# Patient Record
Sex: Male | Born: 1950 | Race: White | Hispanic: No | Marital: Married | Smoking: Never smoker
Health system: Southern US, Community
[De-identification: ages and names within clinical notes are randomized; demographics above are authoritative.]

## PROBLEM LIST (undated history)

## (undated) DIAGNOSIS — Z87442 Personal history of urinary calculi: Secondary | ICD-10-CM

## (undated) DIAGNOSIS — E119 Type 2 diabetes mellitus without complications: Secondary | ICD-10-CM

## (undated) DIAGNOSIS — I10 Essential (primary) hypertension: Secondary | ICD-10-CM

## (undated) DIAGNOSIS — Z9889 Other specified postprocedural states: Secondary | ICD-10-CM

## (undated) DIAGNOSIS — C801 Malignant (primary) neoplasm, unspecified: Secondary | ICD-10-CM

## (undated) DIAGNOSIS — G4730 Sleep apnea, unspecified: Secondary | ICD-10-CM

## (undated) DIAGNOSIS — D649 Anemia, unspecified: Secondary | ICD-10-CM

---

## 1999-07-09 ENCOUNTER — Emergency Department (HOSPITAL_COMMUNITY): Admission: EM | Admit: 1999-07-09 | Discharge: 1999-07-09 | Payer: Self-pay

## 2009-12-27 HISTORY — PX: CYSTOSCOPY W/ STONE MANIPULATION: SHX1427

## 2010-11-28 ENCOUNTER — Emergency Department (HOSPITAL_COMMUNITY)
Admission: EM | Admit: 2010-11-28 | Discharge: 2010-11-28 | Payer: Self-pay | Source: Home / Self Care | Admitting: Emergency Medicine

## 2012-01-26 IMAGING — CR DG CERVICAL SPINE COMPLETE 4+V
6 series · 6 of 6 positions shown · non-contrast
Comparison: None.

CLINICAL DATA: Fall, trauma, neck pain

CERVICAL SPINE - COMPLETE 4+ VIEW

[w c-spine lat * (1 of 2)]
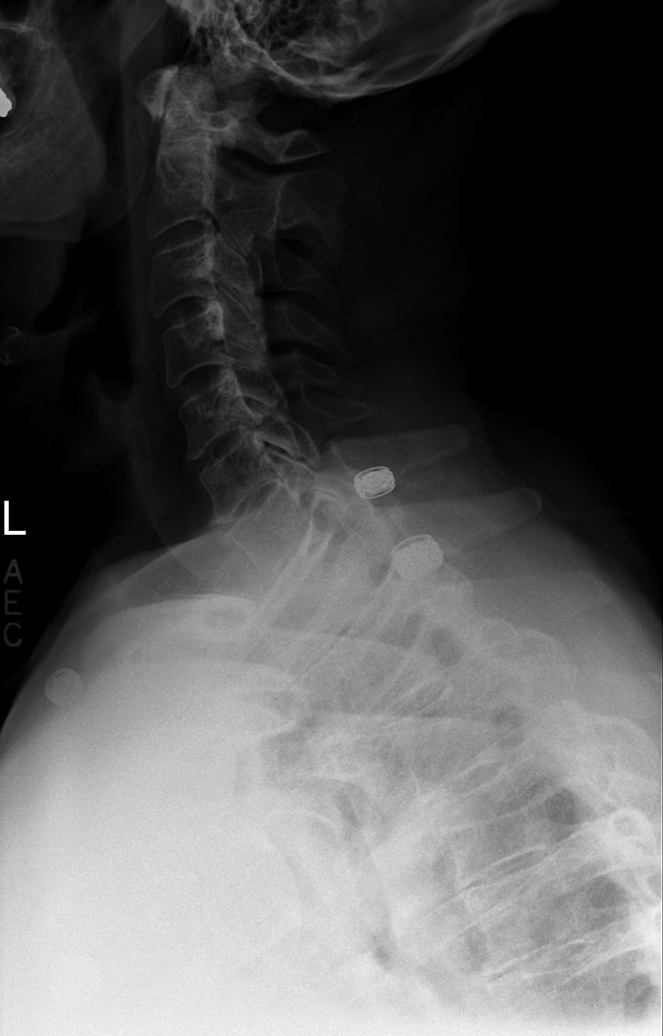

[w c-spine lat * (2 of 2)]
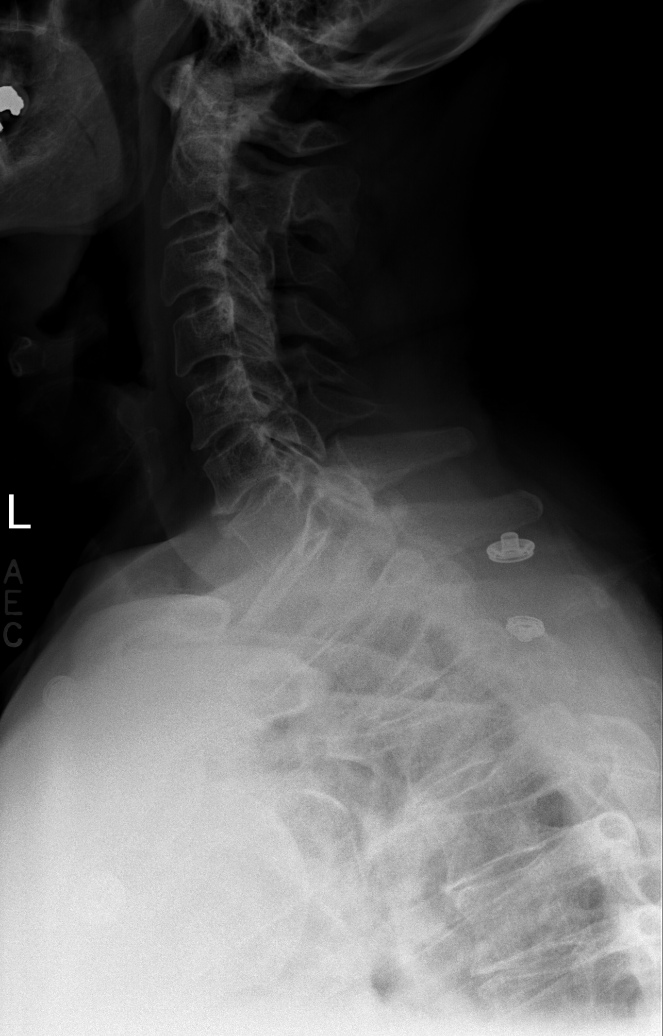

[w c-spine oblique (1 of 2)]
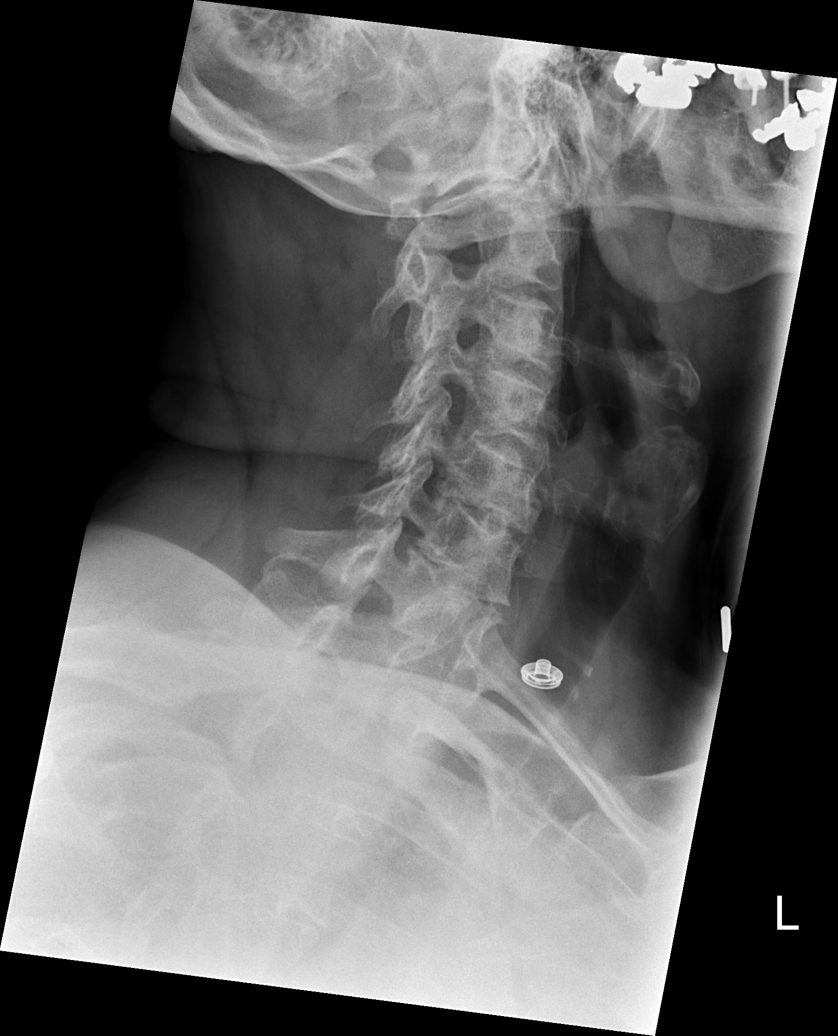

[w c-spine oblique (2 of 2)]
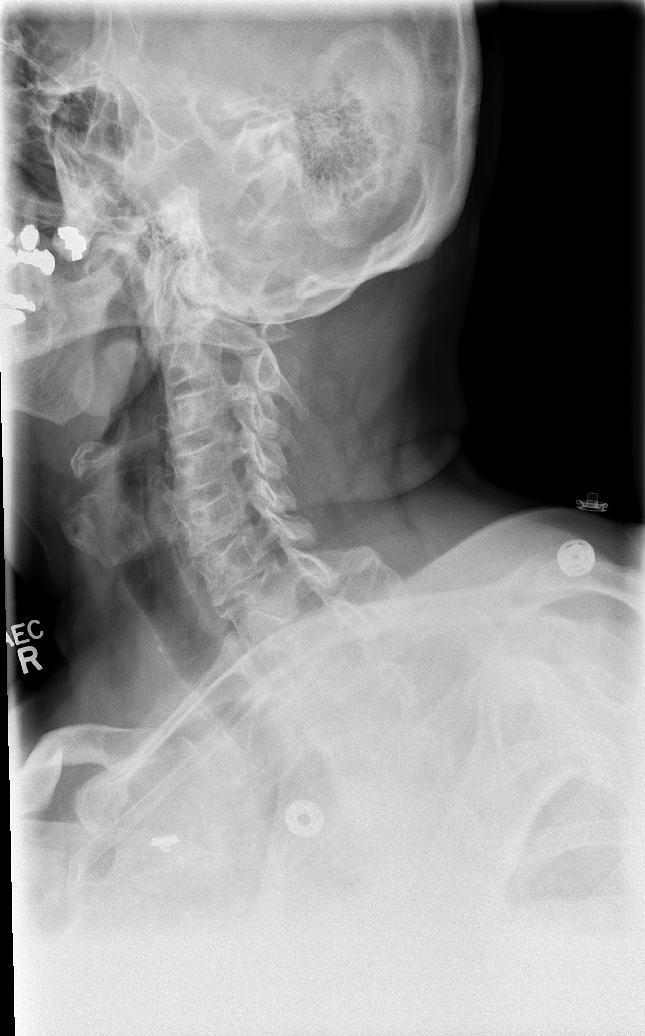

[w c-spine a.p. *]
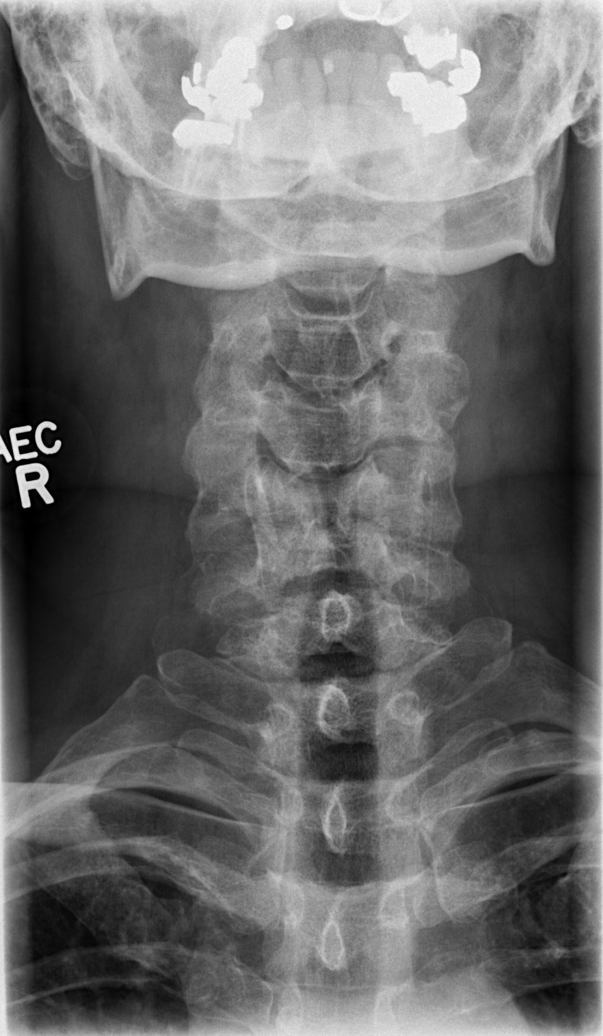

[w c-spine odontoid *]
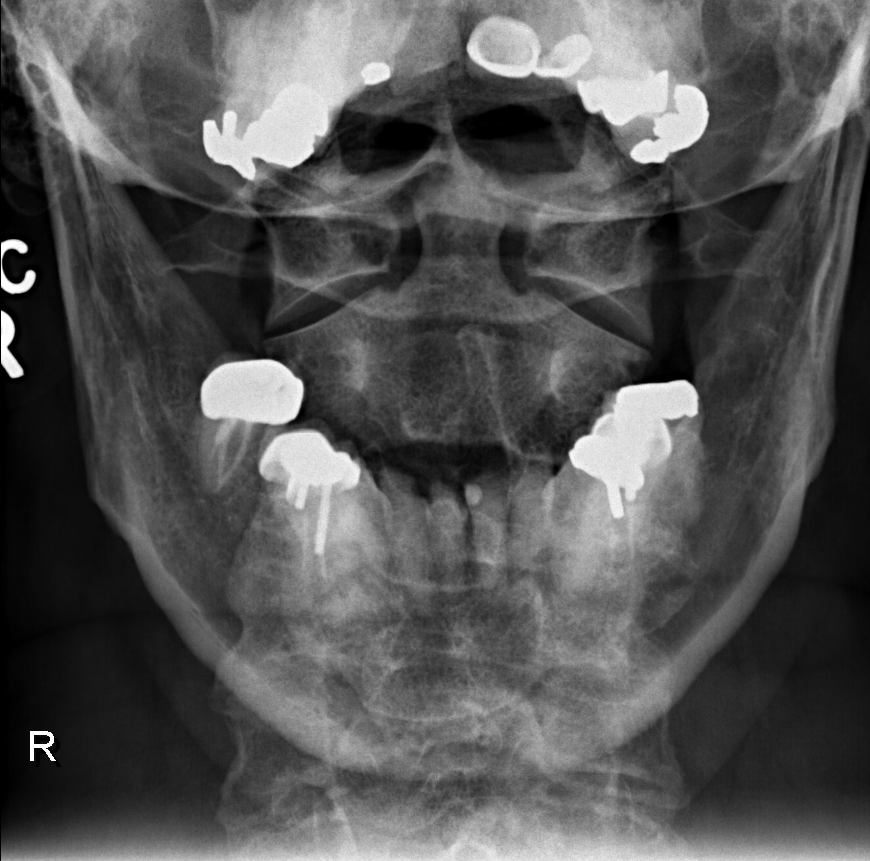

[6 of 6 positions shown; findings below may reference images not displayed]

FINDINGS: Normal cervical spine alignment.  Mild spondylosis and
degenerative changes at C5-6 and C6-7.  No fracture demonstrated,
compression deformity, wedge shaped abnormality or focal kyphosis.
Facets aligned.  Intact odontoid.
IMPRESSION: Lower cervical degenerative changes.

## 2013-12-27 HISTORY — PX: LITHOTRIPSY: SUR834

## 2023-05-18 ENCOUNTER — Inpatient Hospital Stay (HOSPITAL_COMMUNITY)
Admission: EM | Admit: 2023-05-18 | Discharge: 2023-05-20 | DRG: 660 | Disposition: A | Discharge status: Home / Self Care | Payer: Medicare Other | Attending: Internal Medicine | Admitting: Internal Medicine

## 2023-05-18 ENCOUNTER — Other Ambulatory Visit: Payer: Self-pay

## 2023-05-18 ENCOUNTER — Observation Stay (HOSPITAL_COMMUNITY): Payer: Medicare Other

## 2023-05-18 ENCOUNTER — Encounter (HOSPITAL_COMMUNITY): Payer: Self-pay

## 2023-05-18 ENCOUNTER — Encounter (HOSPITAL_COMMUNITY)
Admission: EM | Disposition: A | Discharge status: Home / Self Care | Payer: Self-pay | Source: Home / Self Care | Attending: Internal Medicine

## 2023-05-18 ENCOUNTER — Emergency Department (HOSPITAL_COMMUNITY): Payer: Medicare Other

## 2023-05-18 ENCOUNTER — Observation Stay (HOSPITAL_BASED_OUTPATIENT_CLINIC_OR_DEPARTMENT_OTHER): Payer: Medicare Other | Admitting: Anesthesiology

## 2023-05-18 ENCOUNTER — Observation Stay (HOSPITAL_COMMUNITY): Payer: Medicare Other | Admitting: Anesthesiology

## 2023-05-18 DIAGNOSIS — Z9989 Dependence on other enabling machines and devices: Secondary | ICD-10-CM

## 2023-05-18 DIAGNOSIS — E119 Type 2 diabetes mellitus without complications: Secondary | ICD-10-CM

## 2023-05-18 DIAGNOSIS — N201 Calculus of ureter: Secondary | ICD-10-CM

## 2023-05-18 DIAGNOSIS — G4733 Obstructive sleep apnea (adult) (pediatric): Secondary | ICD-10-CM | POA: Diagnosis not present

## 2023-05-18 DIAGNOSIS — Z794 Long term (current) use of insulin: Secondary | ICD-10-CM

## 2023-05-18 DIAGNOSIS — N179 Acute kidney failure, unspecified: Principal | ICD-10-CM | POA: Diagnosis present

## 2023-05-18 DIAGNOSIS — N200 Calculus of kidney: Secondary | ICD-10-CM | POA: Diagnosis not present

## 2023-05-18 DIAGNOSIS — E785 Hyperlipidemia, unspecified: Secondary | ICD-10-CM | POA: Diagnosis present

## 2023-05-18 DIAGNOSIS — N202 Calculus of kidney with calculus of ureter: Secondary | ICD-10-CM | POA: Diagnosis not present

## 2023-05-18 DIAGNOSIS — Z9641 Presence of insulin pump (external) (internal): Secondary | ICD-10-CM | POA: Diagnosis present

## 2023-05-18 DIAGNOSIS — D631 Anemia in chronic kidney disease: Secondary | ICD-10-CM | POA: Diagnosis present

## 2023-05-18 DIAGNOSIS — Z888 Allergy status to other drugs, medicaments and biological substances status: Secondary | ICD-10-CM

## 2023-05-18 DIAGNOSIS — I10 Essential (primary) hypertension: Secondary | ICD-10-CM

## 2023-05-18 DIAGNOSIS — N289 Disorder of kidney and ureter, unspecified: Secondary | ICD-10-CM

## 2023-05-18 DIAGNOSIS — G4730 Sleep apnea, unspecified: Secondary | ICD-10-CM | POA: Insufficient documentation

## 2023-05-18 DIAGNOSIS — N1832 Chronic kidney disease, stage 3b: Secondary | ICD-10-CM | POA: Insufficient documentation

## 2023-05-18 DIAGNOSIS — E1122 Type 2 diabetes mellitus with diabetic chronic kidney disease: Secondary | ICD-10-CM | POA: Diagnosis present

## 2023-05-18 DIAGNOSIS — E875 Hyperkalemia: Secondary | ICD-10-CM | POA: Diagnosis not present

## 2023-05-18 DIAGNOSIS — I129 Hypertensive chronic kidney disease with stage 1 through stage 4 chronic kidney disease, or unspecified chronic kidney disease: Secondary | ICD-10-CM | POA: Diagnosis present

## 2023-05-18 DIAGNOSIS — D649 Anemia, unspecified: Secondary | ICD-10-CM | POA: Insufficient documentation

## 2023-05-18 HISTORY — DX: Type 2 diabetes mellitus without complications: E11.9

## 2023-05-18 HISTORY — DX: Sleep apnea, unspecified: G47.30

## 2023-05-18 HISTORY — DX: Essential (primary) hypertension: I10

## 2023-05-18 HISTORY — PX: CYSTOSCOPY WITH RETROGRADE PYELOGRAM, URETEROSCOPY AND STENT PLACEMENT: SHX5789

## 2023-05-18 LAB — URINALYSIS, ROUTINE W REFLEX MICROSCOPIC
Bilirubin Urine: NEGATIVE
Glucose, UA: NEGATIVE mg/dL
Ketones, ur: NEGATIVE mg/dL
Leukocytes,Ua: NEGATIVE
Nitrite: NEGATIVE
Protein, ur: NEGATIVE mg/dL
Specific Gravity, Urine: 1.015 (ref 1.005–1.030)
pH: 5 (ref 5.0–8.0)

## 2023-05-18 LAB — COMPREHENSIVE METABOLIC PANEL
ALT: 17 U/L (ref 0–44)
AST: 15 U/L (ref 15–41)
Albumin: 3.8 g/dL (ref 3.5–5.0)
Alkaline Phosphatase: 84 U/L (ref 38–126)
Anion gap: 10 (ref 5–15)
BUN: 48 mg/dL — ABNORMAL HIGH (ref 8–23)
CO2: 22 mmol/L (ref 22–32)
Calcium: 8.9 mg/dL (ref 8.9–10.3)
Chloride: 104 mmol/L (ref 98–111)
Creatinine, Ser: 3.06 mg/dL — ABNORMAL HIGH (ref 0.61–1.24)
GFR, Estimated: 21 mL/min — ABNORMAL LOW (ref >60)
Glucose, Bld: 219 mg/dL — ABNORMAL HIGH (ref 70–99)
Potassium: 4.9 mmol/L (ref 3.5–5.1)
Sodium: 136 mmol/L (ref 135–145)
Total Bilirubin: 0.9 mg/dL (ref 0.3–1.2)
Total Protein: 7.7 g/dL (ref 6.5–8.1)

## 2023-05-18 LAB — CBC WITH DIFFERENTIAL/PLATELET
Abs Immature Granulocytes: 0.05 10*3/uL (ref 0.00–0.07)
Basophils Absolute: 0.1 10*3/uL (ref 0.0–0.1)
Basophils Relative: 0 %
Eosinophils Absolute: 0 10*3/uL (ref 0.0–0.5)
Eosinophils Relative: 0 %
HCT: 36.1 % — ABNORMAL LOW (ref 39.0–52.0)
Hemoglobin: 11.8 g/dL — ABNORMAL LOW (ref 13.0–17.0)
Immature Granulocytes: 0 %
Lymphocytes Relative: 5 %
Lymphs Abs: 0.6 10*3/uL — ABNORMAL LOW (ref 0.7–4.0)
MCH: 28.1 pg (ref 26.0–34.0)
MCHC: 32.7 g/dL (ref 30.0–36.0)
MCV: 86 fL (ref 80.0–100.0)
Monocytes Absolute: 0.6 10*3/uL (ref 0.1–1.0)
Monocytes Relative: 5 %
Neutro Abs: 10.8 10*3/uL — ABNORMAL HIGH (ref 1.7–7.7)
Neutrophils Relative %: 90 %
Platelets: 232 10*3/uL (ref 150–400)
RBC: 4.2 MIL/uL — ABNORMAL LOW (ref 4.22–5.81)
RDW: 13.7 % (ref 11.5–15.5)
WBC: 12.2 10*3/uL — ABNORMAL HIGH (ref 4.0–10.5)
nRBC: 0 % (ref 0.0–0.2)

## 2023-05-18 LAB — GLUCOSE, CAPILLARY
Glucose-Capillary: 170 mg/dL — ABNORMAL HIGH (ref 70–99)
Glucose-Capillary: 167 mg/dL — ABNORMAL HIGH (ref 70–99)
Glucose-Capillary: 194 mg/dL — ABNORMAL HIGH (ref 70–99)
Glucose-Capillary: 261 mg/dL — ABNORMAL HIGH (ref 70–99)

## 2023-05-18 SURGERY — CYSTOURETEROSCOPY, WITH RETROGRADE PYELOGRAM AND STENT INSERTION
Anesthesia: General | Laterality: Left

## 2023-05-18 MED ORDER — ORAL CARE MOUTH RINSE: 15.0000 mL | Freq: Once | OROMUCOSAL | Status: DC

## 2023-05-18 MED ORDER — PROPOFOL 10 MG/ML IV BOLUS
INTRAVENOUS | Status: AC
  Filled 2023-05-18: qty 20

## 2023-05-18 MED ORDER — SUCCINYLCHOLINE CHLORIDE 200 MG/10ML IV SOSY
PREFILLED_SYRINGE | INTRAVENOUS | Status: AC
  Filled 2023-05-18: qty 20

## 2023-05-18 MED ORDER — LACTATED RINGERS IV SOLN: INTRAVENOUS | Status: DC

## 2023-05-18 MED ORDER — PROMETHAZINE HCL 25 MG/ML IJ SOLN: 6.2500 mg | INTRAMUSCULAR | Status: DC | PRN

## 2023-05-18 MED ORDER — PHENYLEPHRINE HCL (PRESSORS) 10 MG/ML IV SOLN
INTRAVENOUS | Status: DC | PRN
  Administered 2023-05-18: 160 ug via INTRAVENOUS

## 2023-05-18 MED ORDER — FENTANYL CITRATE (PF) 100 MCG/2ML IJ SOLN
INTRAMUSCULAR | Status: AC
  Filled 2023-05-18: qty 2

## 2023-05-18 MED ORDER — ROCURONIUM BROMIDE 10 MG/ML (PF) SYRINGE
PREFILLED_SYRINGE | INTRAVENOUS | Status: AC
  Filled 2023-05-18: qty 20

## 2023-05-18 MED ORDER — ORAL CARE MOUTH RINSE: 15.0000 mL | OROMUCOSAL | Status: DC | PRN

## 2023-05-18 MED ORDER — SUGAMMADEX SODIUM 200 MG/2ML IV SOLN
INTRAVENOUS | Status: DC | PRN
  Administered 2023-05-18: 200 mg via INTRAVENOUS
  Administered 2023-05-18: 600 mg via INTRAVENOUS

## 2023-05-18 MED ORDER — AMISULPRIDE (ANTIEMETIC) 5 MG/2ML IV SOLN
INTRAVENOUS | Status: AC
  Filled 2023-05-18: qty 4

## 2023-05-18 MED ORDER — MORPHINE SULFATE (PF) 4 MG/ML IV SOLN
4.0000 mg | Freq: Once | INTRAVENOUS | Status: AC
  Administered 2023-05-18: 4 mg via INTRAVENOUS
  Filled 2023-05-18: qty 1

## 2023-05-18 MED ORDER — ONDANSETRON HCL 4 MG/2ML IJ SOLN
4.0000 mg | Freq: Once | INTRAMUSCULAR | Status: AC
  Administered 2023-05-18: 4 mg via INTRAVENOUS
  Filled 2023-05-18: qty 2

## 2023-05-18 MED ORDER — FENTANYL CITRATE PF 50 MCG/ML IJ SOSY: 25.0000 ug | PREFILLED_SYRINGE | INTRAMUSCULAR | Status: DC | PRN

## 2023-05-18 MED ORDER — PHENYLEPHRINE 80 MCG/ML (10ML) SYRINGE FOR IV PUSH (FOR BLOOD PRESSURE SUPPORT)
PREFILLED_SYRINGE | INTRAVENOUS | Status: AC
  Filled 2023-05-18: qty 10

## 2023-05-18 MED ORDER — AMLODIPINE BESYLATE 10 MG PO TABS
10.0000 mg | ORAL_TABLET | Freq: Every day | ORAL | Status: DC
  Administered 2023-05-19 – 2023-05-20 (×2): 10 mg via ORAL
  Filled 2023-05-18 (×2): qty 1

## 2023-05-18 MED ORDER — POTASSIUM CHLORIDE IN NACL 20-0.9 MEQ/L-% IV SOLN
INTRAVENOUS | Status: DC
  Filled 2023-05-18 (×2): qty 1000

## 2023-05-18 MED ORDER — MIDAZOLAM HCL 5 MG/5ML IJ SOLN
INTRAMUSCULAR | Status: DC | PRN
  Administered 2023-05-18 (×2): 1 mg via INTRAVENOUS

## 2023-05-18 MED ORDER — DEXAMETHASONE SODIUM PHOSPHATE 10 MG/ML IJ SOLN
INTRAMUSCULAR | Status: DC | PRN
  Administered 2023-05-18: 10 mg via INTRAVENOUS

## 2023-05-18 MED ORDER — ACETAMINOPHEN 650 MG RE SUPP: 650.0000 mg | Freq: Four times a day (QID) | RECTAL | Status: DC | PRN

## 2023-05-18 MED ORDER — LIDOCAINE HCL (CARDIAC) PF 100 MG/5ML IV SOSY
PREFILLED_SYRINGE | INTRAVENOUS | Status: DC | PRN
  Administered 2023-05-18: 20 mg via INTRAVENOUS

## 2023-05-18 MED ORDER — OXYCODONE HCL 5 MG PO TABS: 5.0000 mg | ORAL_TABLET | Freq: Once | ORAL | Status: DC | PRN

## 2023-05-18 MED ORDER — INSULIN ASPART 100 UNIT/ML IJ SOLN
0.0000 [IU] | Freq: Three times a day (TID) | INTRAMUSCULAR | Status: DC
  Administered 2023-05-18: 3 [IU] via SUBCUTANEOUS
  Administered 2023-05-19: 11 [IU] via SUBCUTANEOUS
  Administered 2023-05-19: 8 [IU] via SUBCUTANEOUS
  Administered 2023-05-19: 5 [IU] via SUBCUTANEOUS
  Administered 2023-05-20: 3 [IU] via SUBCUTANEOUS

## 2023-05-18 MED ORDER — EPHEDRINE SULFATE (PRESSORS) 50 MG/ML IJ SOLN
INTRAMUSCULAR | Status: DC | PRN
  Administered 2023-05-18: 5 mg via INTRAVENOUS

## 2023-05-18 MED ORDER — OXYCODONE HCL 5 MG PO TABS: 5.0000 mg | ORAL_TABLET | ORAL | Status: DC | PRN

## 2023-05-18 MED ORDER — PROPOFOL 10 MG/ML IV BOLUS
INTRAVENOUS | Status: DC | PRN
  Administered 2023-05-18: 150 mg via INTRAVENOUS
  Administered 2023-05-18: 50 mg via INTRAVENOUS

## 2023-05-18 MED ORDER — MIDAZOLAM HCL 2 MG/2ML IJ SOLN: 0.5000 mg | Freq: Once | INTRAMUSCULAR | Status: DC | PRN

## 2023-05-18 MED ORDER — ALBUTEROL SULFATE (2.5 MG/3ML) 0.083% IN NEBU: 2.5000 mg | INHALATION_SOLUTION | RESPIRATORY_TRACT | Status: DC | PRN

## 2023-05-18 MED ORDER — ROCURONIUM BROMIDE 100 MG/10ML IV SOLN
INTRAVENOUS | Status: DC | PRN
  Administered 2023-05-18: 50 mg via INTRAVENOUS

## 2023-05-18 MED ORDER — DEXAMETHASONE SODIUM PHOSPHATE 10 MG/ML IJ SOLN
INTRAMUSCULAR | Status: AC
  Filled 2023-05-18: qty 1

## 2023-05-18 MED ORDER — ATORVASTATIN CALCIUM 40 MG PO TABS
40.0000 mg | ORAL_TABLET | Freq: Every day | ORAL | Status: DC
  Administered 2023-05-18 – 2023-05-19 (×2): 40 mg via ORAL
  Filled 2023-05-18 (×2): qty 1

## 2023-05-18 MED ORDER — LIDOCAINE HCL (PF) 2 % IJ SOLN
INTRAMUSCULAR | Status: AC
  Filled 2023-05-18: qty 5

## 2023-05-18 MED ORDER — SODIUM CHLORIDE 0.9 % IR SOLN
Status: DC | PRN
  Administered 2023-05-18: 3000 mL via INTRAVESICAL

## 2023-05-18 MED ORDER — INSULIN ASPART 100 UNIT/ML IJ SOLN
0.0000 [IU] | Freq: Every day | INTRAMUSCULAR | Status: DC
  Administered 2023-05-18: 3 [IU] via SUBCUTANEOUS
  Administered 2023-05-20: 4 [IU] via SUBCUTANEOUS

## 2023-05-18 MED ORDER — SODIUM CHLORIDE 0.9 % IV BOLUS
1000.0000 mL | Freq: Once | INTRAVENOUS | Status: AC
  Administered 2023-05-18: 1000 mL via INTRAVENOUS

## 2023-05-18 MED ORDER — CHLORHEXIDINE GLUCONATE 0.12 % MT SOLN: 15.0000 mL | Freq: Once | OROMUCOSAL | Status: DC

## 2023-05-18 MED ORDER — CIPROFLOXACIN IN D5W 400 MG/200ML IV SOLN
400.0000 mg | Freq: Once | INTRAVENOUS | Status: AC
  Administered 2023-05-18: 400 mg via INTRAVENOUS
  Filled 2023-05-18: qty 200

## 2023-05-18 MED ORDER — OXYCODONE HCL 5 MG/5ML PO SOLN: 5.0000 mg | Freq: Once | ORAL | Status: DC | PRN

## 2023-05-18 MED ORDER — HYDROMORPHONE HCL 1 MG/ML IJ SOLN
0.5000 mg | Freq: Once | INTRAMUSCULAR | Status: AC
  Administered 2023-05-18: 0.5 mg via INTRAVENOUS
  Filled 2023-05-18: qty 1

## 2023-05-18 MED ORDER — TRAZODONE HCL 50 MG PO TABS: 25.0000 mg | ORAL_TABLET | Freq: Every evening | ORAL | Status: DC | PRN

## 2023-05-18 MED ORDER — MEPERIDINE HCL 50 MG/ML IJ SOLN: 6.2500 mg | INTRAMUSCULAR | Status: DC | PRN

## 2023-05-18 MED ORDER — EPHEDRINE 5 MG/ML INJ
INTRAVENOUS | Status: AC
  Filled 2023-05-18: qty 5

## 2023-05-18 MED ORDER — ENOXAPARIN SODIUM 30 MG/0.3ML IJ SOSY: 30.0000 mg | PREFILLED_SYRINGE | INTRAMUSCULAR | Status: DC

## 2023-05-18 MED ORDER — FENTANYL CITRATE (PF) 100 MCG/2ML IJ SOLN
INTRAMUSCULAR | Status: DC | PRN
  Administered 2023-05-18 (×2): 50 ug via INTRAVENOUS

## 2023-05-18 MED ORDER — MIDAZOLAM HCL 2 MG/2ML IJ SOLN
INTRAMUSCULAR | Status: AC
  Filled 2023-05-18: qty 2

## 2023-05-18 MED ORDER — KETOROLAC TROMETHAMINE 30 MG/ML IJ SOLN: 30.0000 mg | Freq: Once | INTRAMUSCULAR | Status: DC

## 2023-05-18 MED ORDER — METOPROLOL TARTRATE 5 MG/5ML IV SOLN: 5.0000 mg | Freq: Four times a day (QID) | INTRAVENOUS | Status: DC | PRN

## 2023-05-18 MED ORDER — IOHEXOL 300 MG/ML  SOLN
INTRAMUSCULAR | Status: DC | PRN
  Administered 2023-05-18: 7 mL via URETHRAL

## 2023-05-18 MED ORDER — ACETAMINOPHEN 325 MG PO TABS: 650.0000 mg | ORAL_TABLET | Freq: Four times a day (QID) | ORAL | Status: DC | PRN

## 2023-05-18 MED ORDER — AMISULPRIDE (ANTIEMETIC) 5 MG/2ML IV SOLN
10.0000 mg | Freq: Once | INTRAVENOUS | Status: AC
  Administered 2023-05-18: 10 mg via INTRAVENOUS

## 2023-05-18 MED ORDER — ONDANSETRON HCL 4 MG/2ML IJ SOLN: 4.0000 mg | Freq: Four times a day (QID) | INTRAMUSCULAR | Status: DC | PRN

## 2023-05-18 MED ORDER — ONDANSETRON HCL 4 MG PO TABS: 4.0000 mg | ORAL_TABLET | Freq: Four times a day (QID) | ORAL | Status: DC | PRN

## 2023-05-18 SURGICAL SUPPLY — 11 items
BAG URO CATCHER STRL LF (MISCELLANEOUS) ×1 IMPLANT
CATH URETL OPEN END 6FR 70 (CATHETERS) ×1 IMPLANT
CLOTH BEACON ORANGE TIMEOUT ST (SAFETY) ×1 IMPLANT
GLOVE SURG LX STRL 7.5 STRW (GLOVE) ×1 IMPLANT
GOWN STRL REUS W/ TWL XL LVL3 (GOWN DISPOSABLE) ×2 IMPLANT
GOWN STRL REUS W/TWL XL LVL3 (GOWN DISPOSABLE) ×2
GUIDEWIRE STR DUAL SENSOR (WIRE) ×1 IMPLANT
MANIFOLD NEPTUNE II (INSTRUMENTS) ×1 IMPLANT
PACK CYSTO (CUSTOM PROCEDURE TRAY) ×1 IMPLANT
STENT URET 6FRX26 CONTOUR (STENTS) IMPLANT
TUBING CONNECTING 10 (TUBING) IMPLANT

## 2023-05-18 NOTE — Anesthesia Procedure Notes (Signed)
Procedure Name: Intubation Date/Time: 05/18/2023 4:22 PM  Performed by: Shanon Payor, CRNAPre-anesthesia Checklist: Patient identified, Emergency Drugs available, Suction available, Patient being monitored and Timeout performed Patient Re-evaluated:Patient Re-evaluated prior to induction Oxygen Delivery Method: Circle system utilized Preoxygenation: Pre-oxygenation with 100% oxygen Induction Type: IV induction, Rapid sequence and Cricoid Pressure applied Ventilation: Mask ventilation without difficulty Laryngoscope Size: Mac and 3 Grade View: Grade II Tube type: Oral Tube size: 7.5 mm Number of attempts: 1 Airway Equipment and Method: Stylet Placement Confirmation: ETT inserted through vocal cords under direct vision, positive ETCO2, CO2 detector and breath sounds checked- equal and bilateral Secured at: 23 cm Tube secured with: Tape Dental Injury: Teeth and Oropharynx as per pre-operative assessment

## 2023-05-18 NOTE — H&P (Signed)
History and Physical  Jorge Alexander:[id number] DOB: [birth year] DOA: 05/18/2023  PCP: Everlean Cherry, MD   Chief Complaint: left flank pain, vomiting   HPI: Jorge Alexander is a 72 y.o. male with medical history significant for diabetes and diagnosed with 2 obstructing left-sided kidney stones yesterday being admitted to the hospital with acute renal failure and persistent pain and nausea.  Pain began 2 days ago, seen at [facility] ER yesterday diagnosed with stone.  He was feeling better and sent home with pain and nausea medication.  Continues to have pain and significant vomiting so presented here today.  Lab work reveals creatinine up to 3.0, from baseline 1.6.  ER provider discussed with urology Dr. Sherron Monday who has taken the patient to the operating suite for cystoscopy and stent placement. Patient seen after procedure and feels much better, no pain or nausea.  Review of Systems: Please see HPI for pertinent positives and negatives. A complete 10 system review of systems are otherwise negative.  Past Medical History:  Diagnosis Date   Diabetes mellitus without complication (HCC)    History reviewed. No pertinent surgical history.  Social History:  reports that he has never smoked. He has never used smokeless tobacco. He reports that he does not currently use alcohol. He reports that he does not currently use drugs.   No Known Allergies  History reviewed. No pertinent family history.   Prior to Admission medications   Not on File    Physical Exam: BP 135/67   Pulse (!) 59   Temp 97.6 F (36.4 C)   Resp 19   Ht 5\' 7"  (1.702 m)   Wt 113.4 kg   SpO2 94%   BMI 39.16 kg/m   General:  Alert, oriented, calm, in no acute distress  Eyes: EOMI, clear conjuctivae, white sclerea Neck: supple, no masses, trachea mildline  Cardiovascular: RRR, no murmurs or rubs, no peripheral edema  Respiratory: clear to auscultation bilaterally, no wheezes, no crackles  Abdomen: soft,  nontender, nondistended, normal bowel tones heard  Skin: dry, no rashes  Musculoskeletal: no joint effusions, normal range of motion  Psychiatric: appropriate affect, normal speech  Neurologic: extraocular muscles intact, clear speech, moving all extremities with intact sensorium          Labs on Admission:  Basic Metabolic Panel: Recent Labs  Lab 05/18/23 1018  NA 136  K 4.9  CL 104  CO2 22  GLUCOSE 219*  BUN 48*  CREATININE 3.06*  CALCIUM 8.9   Liver Function Tests: Recent Labs  Lab 05/18/23 1018  AST 15  ALT 17  ALKPHOS 84  BILITOT 0.9  PROT 7.7  ALBUMIN 3.8   No results for input(s): "LIPASE", "AMYLASE" in the last 168 hours. No results for input(s): "AMMONIA" in the last 168 hours. CBC: Recent Labs  Lab 05/18/23 1018  WBC 12.2*  NEUTROABS 10.8*  HGB 11.8*  HCT 36.1*  MCV 86.0  PLT 232   Cardiac Enzymes: No results for input(s): "CKTOTAL", "CKMB", "CKMBINDEX", "TROPONINI" in the last 168 hours.  BNP (last 3 results) No results for input(s): "BNP" in the last 8760 hours.  ProBNP (last 3 results) No results for input(s): "PROBNP" in the last 8760 hours.  CBG: No results for input(s): "GLUCAP" in the last 168 hours.  Radiological Exams on Admission: CT Renal Stone Study  Result Date: 05/18/2023 CLINICAL DATA:  Abdominal/flank pain, stone suspected left flank pain, stone dx at  EXAM: CT ABDOMEN AND PELVIS WITHOUT CONTRAST TECHNIQUE:  Multidetector CT imaging of the abdomen and pelvis was performed following the standard protocol without IV contrast. RADIATION DOSE REDUCTION: This exam was performed according to the departmental dose-optimization program which includes automated exposure control, adjustment of the mA and/or kV according to patient size and/or use of iterative reconstruction technique. COMPARISON:  CT abdomen pelvis 05/17/2023, CT urogram 02/25/2016 FINDINGS: Lower chest: Small hiatal hernia. Four-vessel coronary calcification.  Hepatobiliary: No focal liver abnormality. No gallstones, gallbladder wall thickening, or pericholecystic fluid. No biliary dilatation. Pancreas: No focal lesion. Normal pancreatic contour. No surrounding inflammatory changes. No main pancreatic ductal dilatation. Spleen: Normal in size without focal abnormality. Adrenals/Urinary Tract: No adrenal nodule bilaterally. There is a 3.1 cm heterogeneous right renal lesion with a density of 48 Hounsfield units. Several fluid density lesions within bilateral kidneys likely represent simple renal cysts. Simple renal cysts, in the absence of clinically indicated signs/symptoms, require no independent follow-up. Couple mid to distal left ureteral stones measuring 2mm and 4 mm with associated proximal moderate hydroureter and mild hydronephrosis (Interval migration of a previously visualized 2 mm left nephrolithiasis into the ureter just distal to the previously obstructive 4 mm ureterolithiasis.) Periureteral fat stranding is noted on the left. Persistent punctate left nephrolithiasis. 11 mm right nephrolithiasis. No right ureterolithiasis. No right hydroureteronephrosis. Bilateral perinephric stranding-nonspecific. Stomach/Bowel: Stomach is within normal limits. No evidence of bowel wall thickening or dilatation. Appendix appears normal. Vascular/Lymphatic: No abdominal aorta or iliac aneurysm. Moderate atherosclerotic plaque of the aorta and its branches. No abdominal, pelvic, or inguinal lymphadenopathy. Reproductive: Prostate is unremarkable. Other: No intraperitoneal free fluid. No intraperitoneal free gas. No organized fluid collection. Musculoskeletal: No abdominal wall hernia or abnormality. No suspicious lytic or blastic osseous lesions. No acute displaced fracture. Multilevel degenerative changes of the spine. IMPRESSION: 1. Obstructive 2 mm and 4 mm distal left ureteral stones (interval migration of a previously visualized 2 mm left nephrolithiasis into the ureter  just distal to the previously obstructive 4 mm ureterolithiasis.) Limited evaluation on this noncontrast study with question of superimposed infection. Recommend correlation with urinalysis and consideration of urologic consultation if clinically indicated. 2. Nonobstructive bilateral nephrolithiasis measuring up to 11 mm on the right and punctate on the left. 3. Indeterminate 3.1 cm heterogeneous right renal lesion. Recommend MRI or CT renal protocol further evaluation. When the patient is clinically stable and able to follow directions and hold their breath (preferably as an outpatient) further evaluation with dedicated abdominal MRI should be considered. 4. Small hiatal hernia. 5. Aortic Atherosclerosis (ICD10-I70.0) -including four-vessel coronary artery calcifications. Electronically Signed   By: Tish Frederickson M.D.   On: 05/18/2023 11:16    Assessment/Plan This is a pleasant 72 year old gentleman with a history of insulin dependant diabetes and recently diagnosed obstructing left-sided ureteral stone admitted to the hospital with intractable vomiting, acute renal failure.  CT scan here today confirms obstructive 2 mm and 4 mm distal left ureteral stones. He has AKI from renal obstructing stone and is now s/p stent placement. -Observation admission -Resume diabetic diet -He uses insulin pump but does not know basal rate, will treat with SSI for now and add basal insulin as indicated -Pain and nausea control as needed -Given acute renal failure, avoid nephrotoxins, hydrate, recheck renal function in the morning; as such will hold home lisinopril - Home norvasc for HTN - home lipitor for HLD  DVT prophylaxis: Lovenox     Code Status: Full Code  Consults called: None  Admission status: Observation  Time spent: 52 minutes  Shaine Mount Sharlette Dense MD [facility] Pager [phone]  If 7PM-7AM, please contact night-coverage [email or web address] Password [facility]  05/18/2023, 3:40 PM

## 2023-05-18 NOTE — Anesthesia Preprocedure Evaluation (Addendum)
Anesthesia Evaluation  Patient identified by MRN, date of birth, ID band Patient awake    Reviewed: Allergy & Precautions, NPO status , Patient's Chart, lab work & pertinent test results  History of Anesthesia Complications Negative for: history of anesthetic complications  Airway Mallampati: II  TM Distance: >3 FB Neck ROM: Full    Dental  (+) Caps, Dental Advisory Given   Pulmonary sleep apnea and Continuous Positive Airway Pressure Ventilation    breath sounds clear to auscultation       Cardiovascular hypertension, (-) angina  Rhythm:Regular Rate:Normal     Neuro/Psych negative neurological ROS     GI/Hepatic Neg liver ROS,,,N/V with ureteral stone   Endo/Other  diabetes, Insulin Dependent  BMI 39  Renal/GU stones     Musculoskeletal   Abdominal  (+) + obese  Peds  Hematology negative hematology ROS (+)   Anesthesia Other Findings   Reproductive/Obstetrics                             Anesthesia Physical Anesthesia Plan  ASA: 3  Anesthesia Plan: General   Post-op Pain Management: Ofirmev IV (intra-op)*   Induction: Intravenous and Rapid sequence  PONV Risk Score and Plan: 2 and Dexamethasone, Ondansetron, Treatment may vary due to age or medical condition and Amisulpride  Airway Management Planned: Oral ETT  Additional Equipment: None  Intra-op Plan:   Post-operative Plan: Extubation in OR  Informed Consent: I have reviewed the patients History and Physical, chart, labs and discussed the procedure including the risks, benefits and alternatives for the proposed anesthesia with the patient or authorized representative who has indicated his/her understanding and acceptance.     Dental advisory given  Plan Discussed with: CRNA and Surgeon  Anesthesia Plan Comments:         Anesthesia Quick Evaluation

## 2023-05-18 NOTE — Op Note (Signed)
Preoperative diagnosis: Left ureteral stone Postoperative diagnosis: Left ureteral stone Surgery: Cystoscopy left retrograde ureterogram and insertion of left ureteral stent Surgeon: Dr. Lorin Picket Tal Kempker  The patient has the above diagnoses and consented to the above procedure.  He was given ciprofloxacin prior.  Extra care was taken leg positioning  Patient underwent cystoscopy with a 24 [place] scope.  Penile bulbar urethra normal.  He had a shorter prostatic urethra.  He had a small middle lobe.  Jorge Alexander is identified.  No cystitis.  Bladder mucosa normal  Under fluoroscopic guidance I passed a sensor wire to the lobe mid ureter.  I passed a 6 [place] open-ended ureteral catheter to that avoid removing the wire.  I did a gentle retrograde ureterogram with 7 cc of contrast.  I did not see a filling defect but I could see a mildly dilated upper left ureter renal pelvis and calyces.  Sensor wire was then passed to that level of the upper pole calyx.  I then passed a 26 cm x 6 French stent curling in the renal pelvis and in the bladder nicely.  There was a mild volcano effect.  Bladder was emptied and patient was taken to recovery without a Foley catheter.  He will be followed as per protocol

## 2023-05-18 NOTE — ED Provider Notes (Signed)
[place] EMERGENCY DEPARTMENT AT [facility] Provider Note   CSN: [id number] Arrival date & time: 05/18/23  0957     History  Chief Complaint  Patient presents with   Flank Pain    Jorge Alexander is a 72 y.o. male history of diabetes here for evaluation of left flank pain.  Began on Tuesday.  [facility] diagnosed with #2 obstructing left ureteral stones.  Was sent home pain management and Zofran.  He continues to have pain and persistent vomiting.  No fever, dysuria, hematuria, chest pain, shortness of breath, lower extremity swelling, bloody stool, diarrhea.  Last p.o. intake yesterday  HPI     Home Medications Prior to Admission medications   Not on File      Allergies    Patient has no known allergies.    Review of Systems   Review of Systems  Constitutional: Negative.   HENT: Negative.    Respiratory: Negative.    Cardiovascular: Negative.   Gastrointestinal:  Positive for nausea and vomiting. Negative for abdominal distention, abdominal pain, anal bleeding, blood in stool, constipation, diarrhea and rectal pain.  Genitourinary:  Positive for flank pain.  Skin: Negative.   Neurological: Negative.   All other systems reviewed and are negative.   Physical Exam Updated Vital Signs BP 135/67   Pulse (!) 59   Temp 97.6 F (36.4 C)   Resp 19   Ht 5\' 7"  (1.702 m)   Wt 113.4 kg   SpO2 94%   BMI 39.16 kg/m  Physical Exam Vitals and nursing note reviewed.  Constitutional:      General: He is not in acute distress.    Appearance: He is well-developed. He is not ill-appearing, toxic-appearing or diaphoretic.  HENT:     Head: Normocephalic and atraumatic.     Nose: Nose normal.     Mouth/Throat:     Mouth: Mucous membranes are moist.  Eyes:     Pupils: Pupils are equal, round, and reactive to light.  Cardiovascular:     Rate and Rhythm: Normal rate and regular rhythm.     Pulses: Normal pulses.     Heart sounds: Normal  heart sounds.  Pulmonary:     Effort: Pulmonary effort is normal. No respiratory distress.     Breath sounds: Normal breath sounds.  Abdominal:     General: Bowel sounds are normal. There is no distension.     Palpations: Abdomen is soft. There is no mass.     Tenderness: There is no abdominal tenderness. There is no right CVA tenderness, left CVA tenderness, guarding or rebound.     Hernia: No hernia is present.  Musculoskeletal:        General: Normal range of motion.     Cervical back: Normal range of motion and neck supple.  Skin:    General: Skin is warm and dry.     Capillary Refill: Capillary refill takes less than 2 seconds.  Neurological:     General: No focal deficit present.     Mental Status: He is alert and oriented to person, place, and time.    ED Results / Procedures / Treatments   Labs (all labs ordered are listed, but only abnormal results are displayed) Labs Reviewed  CBC WITH DIFFERENTIAL/PLATELET - Abnormal; Notable for the following components:      Result Value   WBC 12.2 (*)    RBC 4.20 (*)    Hemoglobin 11.8 (*)  HCT 36.1 (*)    Neutro Abs 10.8 (*)    Lymphs Abs 0.6 (*)    All other components within normal limits  COMPREHENSIVE METABOLIC PANEL - Abnormal; Notable for the following components:   Glucose, Bld 219 (*)    BUN 48 (*)    Creatinine, Ser 3.06 (*)    GFR, Estimated 21 (*)    All other components within normal limits  URINALYSIS, ROUTINE W REFLEX MICROSCOPIC - Abnormal; Notable for the following components:   Hgb urine dipstick MODERATE (*)    Bacteria, UA RARE (*)    All other components within normal limits    EKG None  Radiology CT Renal Stone Study  Result Date: 05/18/2023 CLINICAL DATA:  Abdominal/flank pain, stone suspected left flank pain, stone dx at [place] EXAM: CT ABDOMEN AND PELVIS WITHOUT CONTRAST TECHNIQUE: Multidetector CT imaging of the abdomen and pelvis was performed following the standard protocol without IV  contrast. RADIATION DOSE REDUCTION: This exam was performed according to the departmental dose-optimization program which includes automated exposure control, adjustment of the mA and/or kV according to patient size and/or use of iterative reconstruction technique. COMPARISON:  CT abdomen pelvis 05/17/2023, CT urogram 02/25/2016 FINDINGS: Lower chest: Small hiatal hernia. Four-vessel coronary calcification. Hepatobiliary: No focal liver abnormality. No gallstones, gallbladder wall thickening, or pericholecystic fluid. No biliary dilatation. Pancreas: No focal lesion. Normal pancreatic contour. No surrounding inflammatory changes. No main pancreatic ductal dilatation. Spleen: Normal in size without focal abnormality. Adrenals/Urinary Tract: No adrenal nodule bilaterally. There is a 3.1 cm heterogeneous right renal lesion with a density of 48 Hounsfield units. Several fluid density lesions within bilateral kidneys likely represent simple renal cysts. Simple renal cysts, in the absence of clinically indicated signs/symptoms, require no independent follow-up. Couple mid to distal left ureteral stones measuring 2mm and 4 mm with associated proximal moderate hydroureter and mild hydronephrosis (Interval migration of a previously visualized 2 mm left nephrolithiasis into the ureter just distal to the previously obstructive 4 mm ureterolithiasis.) Periureteral fat stranding is noted on the left. Persistent punctate left nephrolithiasis. 11 mm right nephrolithiasis. No right ureterolithiasis. No right hydroureteronephrosis. Bilateral perinephric stranding-nonspecific. Stomach/Bowel: Stomach is within normal limits. No evidence of bowel wall thickening or dilatation. Appendix appears normal. Vascular/Lymphatic: No abdominal aorta or iliac aneurysm. Moderate atherosclerotic plaque of the aorta and its branches. No abdominal, pelvic, or inguinal lymphadenopathy. Reproductive: Prostate is unremarkable. Other: No intraperitoneal  free fluid. No intraperitoneal free gas. No organized fluid collection. Musculoskeletal: No abdominal wall hernia or abnormality. No suspicious lytic or blastic osseous lesions. No acute displaced fracture. Multilevel degenerative changes of the spine. IMPRESSION: 1. Obstructive 2 mm and 4 mm distal left ureteral stones (interval migration of a previously visualized 2 mm left nephrolithiasis into the ureter just distal to the previously obstructive 4 mm ureterolithiasis.) Limited evaluation on this noncontrast study with question of superimposed infection. Recommend correlation with urinalysis and consideration of urologic consultation if clinically indicated. 2. Nonobstructive bilateral nephrolithiasis measuring up to 11 mm on the right and punctate on the left. 3. Indeterminate 3.1 cm heterogeneous right renal lesion. Recommend MRI or CT renal protocol further evaluation. When the patient is clinically stable and able to follow directions and hold their breath (preferably as an outpatient) further evaluation with dedicated abdominal MRI should be considered. 4. Small hiatal hernia. 5. Aortic Atherosclerosis (ICD10-I70.0) -including four-vessel coronary artery calcifications. Electronically Signed   By: Tish Frederickson M.D.   On: 05/18/2023 11:16    Procedures Procedures  Medications Ordered in ED Medications  ciprofloxacin (CIPRO) IVPB 400 mg (has no administration in time range)  morphine (PF) 4 MG/ML injection 4 mg (4 mg Intravenous Given 05/18/23 1030)  ondansetron (ZOFRAN) injection 4 mg (4 mg Intravenous Given 05/18/23 1031)  sodium chloride 0.9 % bolus 1,000 mL (0 mLs Intravenous Stopped 05/18/23 1058)  HYDROmorphone (DILAUDID) injection 0.5 mg (0.5 mg Intravenous Given 05/18/23 1142)  ondansetron (ZOFRAN) injection 4 mg (4 mg Intravenous Given 05/18/23 1152)    ED Course/ Medical Decision Making/ A&P Clinical Course as of 05/18/23 1511  Wed May 18, 2023  1144 Persistent emesis and pain  despite morphine and Zofran.  Orders for Dilaudid additional Zofran.  His creatinine is double his baseline will discuss with urology. UA pending [BH]    Clinical Course User Index [BH] Mariselda Badalamenti A, PA-C   72 year old here for evaluation of persistent nausea and vomiting as well as left flank pain in setting of known kidney stone.  Diagnosed yesterday at [facility].  Persistent NBNB emesis and pain despite p.o. narcotics as well as Zofran.  Denies any fever, chest pain, shortness of breath.  He is neurovascularly intact.  Afebrile, nonseptic, not ill-appearing.  Plan on pain management, recheck labs, imaging as cannot view [facility] imaging.  Labs and imaging personally viewed and interpreted:  CBC without leukocytosis metabolic panel creatinine 3.06, baseline 1.6 on February labs UA positive for blood, no UTI CBC leukocytosis 12.2, hemoglobin 0.8 CT stone study with #2 obstructing left ureteral stones correlate with UA for infection  1144: Patient reassessed.  Still has persistent emesis and pain despite morphine and Zofran.  Orders for additional Dilaudid and antiemetic.  1300: Patient reassessed.  Pain has improved.  Has had some nausea without emesis.  I discussed concern for increasing creatinine, doubled from baseline.  Will touch base with urology  1315: discussed with Dr. Sherron Monday with urology.  Will see in consult.  I will admit to medicine for further workup  1445: Dr. Sherron Monday with urology to place stent. Medicine to admit. Rec 400 IV Cipro  [facility] will evaluate for admission.  The patient appears reasonably stabilized for admission considering the current resources, flow, and capabilities available in the ED at this time, and I doubt any other [facility] requiring further screening and/or treatment in the ED prior to admission.                            Medical Decision Making Amount and/or Complexity of Data Reviewed Independent Historian:     Details:  wife External Data Reviewed: labs, radiology, ECG and notes. Labs: ordered. Decision-making details documented in ED Course. Radiology: ordered and independent interpretation performed. Decision-making details documented in ED Course. ECG/medicine tests: ordered and independent interpretation performed. Decision-making details documented in ED Course.  Risk OTC drugs. Prescription drug management. Parenteral controlled substances. Decision regarding hospitalization. Diagnosis or treatment significantly limited by social determinants of health.         Final Clinical Impression(s) / ED Diagnoses Final diagnoses:  AKI (acute kidney injury) (HCC)  Ureteral stone    Rx / DC Orders ED Discharge Orders     None         Rodarius Kichline A, PA-C 05/18/23 1511    Gloris Manchester, MD 05/22/23 1707

## 2023-05-18 NOTE — ED Triage Notes (Signed)
Patient has had left sided flank pain since Tuesday. History of kidney stones.

## 2023-05-18 NOTE — Transfer of Care (Signed)
Immediate Anesthesia Transfer of Care Note  Patient: Jorge Alexander  Procedure(s) Performed: CYSTOSCOPY WITH RETROGRADE PYELOGRAM,  AND STENT PLACEMENT (Left)  Patient Location: PACU  Anesthesia Type:General  Level of Consciousness: awake, alert , and patient cooperative  Airway & Oxygen Therapy: Patient Spontanous Breathing and Patient connected to face mask oxygen  Post-op Assessment: Report given to RN, Post -op Vital signs reviewed and stable, and Patient moving all extremities X 4  Post vital signs: Reviewed and stable  Last Vitals:  Vitals Value Taken Time  BP 131/53 05/18/23 1655  Temp    Pulse 88 05/18/23 1657  Resp 17 05/18/23 1657  SpO2 100 % 05/18/23 1657  Vitals shown include unvalidated device data.  Last Pain:  Vitals:   05/18/23 1548  TempSrc: Oral  PainSc: 2          Complications: No notable events documented.

## 2023-05-18 NOTE — Anesthesia Postprocedure Evaluation (Signed)
Anesthesia Post Note  Patient: MCKENNON MANGE  Procedure(s) Performed: CYSTOSCOPY WITH RETROGRADE PYELOGRAM,  AND STENT PLACEMENT (Left)     Patient location during evaluation: PACU Anesthesia Type: General Level of consciousness: awake and alert, patient cooperative and oriented Pain management: pain level controlled Vital Signs Assessment: post-procedure vital signs reviewed and stable Respiratory status: spontaneous breathing, nonlabored ventilation and respiratory function stable Cardiovascular status: blood pressure returned to baseline and stable Postop Assessment: no apparent nausea or vomiting (pt no longer nauseated) Anesthetic complications: no   No notable events documented.  Last Vitals:  Vitals:   05/18/23 1655 05/18/23 1700  BP: (!) 131/53 (!) 125/55  Pulse: 88 82  Resp: 16 10  Temp: 36.9 C   SpO2: 98% 100%    Last Pain:  Vitals:   05/18/23 1655  TempSrc:   PainSc: Asleep                 Aariah Godette,E. Blayde Bacigalupi

## 2023-05-18 NOTE — ED Notes (Signed)
Pt made aware of UA sample. Unable to void att. Urinal at bedside

## 2023-05-18 NOTE — H&P (Signed)
Chief Complaint: Ureteral stone   History of Present Illness: Patient came to the emergency room today with left flank pain.  He was diagnosed with 2 obstructing left ureteral stones yesterday.  He came in with pain and persistent vomiting.  He was seen yesterday at [facility].  His pain was settling down but he was still nauseated.     Patient has a history of stones.  He had a stent before.  He has passed stones but also had procedures.  He was having left-sided pain which is improved but he was having nausea and vomiting when I was examined him.   Normally voids with a reasonable flow every 2 hours and has mild nocturia.  Does not get bladder infections   White blood count 12.2 serum creatinine 3.06 with baseline being 1.6.   IMPRESSION: 1. Obstructive 2 mm and 4 mm distal left ureteral stones (interval migration of a previously visualized 2 mm left nephrolithiasis into the ureter just distal to the previously obstructive 4 mm ureterolithiasis.) Limited evaluation on this noncontrast study with question of superimposed infection. Recommend correlation with urinalysis and consideration of urologic consultation if clinically indicated. 2. Nonobstructive bilateral nephrolithiasis measuring up to 11 mm on the right and punctate on the left. 3. Indeterminate 3.1 cm heterogeneous right renal lesion. Recommend MRI or CT renal protocol further evaluation. When the patient is clinically stable and able to follow directions and hold their breath (preferably as an outpatient) further evaluation with dedicated abdominal MRI should be considered.           Past Medical History:  Diagnosis Date   Diabetes mellitus without complication (HCC)      History reviewed. No pertinent surgical history.   Medications: I have reviewed the patient's current medications. Allergies: No Known Allergies   History reviewed. No pertinent family history. Social History:  reports that he has never smoked. He  has never used smokeless tobacco. He reports that he does not currently use alcohol. He reports that he does not currently use drugs.   ROS: All systems are reviewed and negative except as noted. Rest negative   Physical Exam:  Vital signs in last 24 hours: Temp:  [97.5 F (36.4 C)-97.6 F (36.4 C)] 97.6 F (36.4 C) (05/22 1304) Pulse Rate:  [68-71] 71 (05/22 1230) Resp:  [12-16] 12 (05/22 1230) BP: (128-155)/(66-75) 128/66 (05/22 1230) SpO2:  [91 %-98 %] 91 % (05/22 1230) Weight:  [113.4 kg] 113.4 kg (05/22 1002)   Cardiovascular: Skin warm; not flushed Respiratory: Breaths quiet; no shortness of breath Abdomen: No masses Neurological: Normal sensation to touch Musculoskeletal: Normal motor function arms and legs Lymphatics: No inguinal adenopathy Skin: No rashes Genitourinary: Patient had minimal left CVA tenderness.  No abdominal tenderness.  Moderately obese   Laboratory Data:  Lab Results Past 72 Hours        Results for orders placed or performed during the hospital encounter of 05/18/23 (from the past 72 hour(s))  CBC with Differential     Status: Abnormal    Collection Time: 05/18/23 10:18 AM  Result Value Ref Range    WBC 12.2 (H) 4.0 - 10.5 K/uL    RBC 4.20 (L) 4.22 - 5.81 MIL/uL    Hemoglobin 11.8 (L) 13.0 - 17.0 g/dL    HCT 16.1 (L) 09.6 - 52.0 %    MCV 86.0 80.0 - 100.0 fL    MCH 28.1 26.0 - 34.0 pg    MCHC 32.7 30.0 - 36.0 g/dL  RDW 13.7 11.5 - 15.5 %    Platelets 232 150 - 400 K/uL    nRBC 0.0 0.0 - 0.2 %    Neutrophils Relative % 90 %    Neutro Abs 10.8 (H) 1.7 - 7.7 K/uL    Lymphocytes Relative 5 %    Lymphs Abs 0.6 (L) 0.7 - 4.0 K/uL    Monocytes Relative 5 %    Monocytes Absolute 0.6 0.1 - 1.0 K/uL    Eosinophils Relative 0 %    Eosinophils Absolute 0.0 0.0 - 0.5 K/uL    Basophils Relative 0 %    Basophils Absolute 0.1 0.0 - 0.1 K/uL    Immature Granulocytes 0 %    Abs Immature Granulocytes 0.05 0.00 - 0.07 K/uL      Comment: Performed at  [facility], [address] [place], [place] [address]  Comprehensive metabolic panel     Status: Abnormal    Collection Time: 05/18/23 10:18 AM  Result Value Ref Range    Sodium 136 135 - 145 mmol/L    Potassium 4.9 3.5 - 5.1 mmol/L    Chloride 104 98 - 111 mmol/L    CO2 22 22 - 32 mmol/L    Glucose, Bld 219 (H) 70 - 99 mg/dL      Comment: Glucose reference range applies only to samples taken after fasting for at least 8 hours.    BUN 48 (H) 8 - 23 mg/dL    Creatinine, Ser 9.14 (H) 0.61 - 1.24 mg/dL    Calcium 8.9 8.9 - 78.2 mg/dL    Total Protein 7.7 6.5 - 8.1 g/dL    Albumin 3.8 3.5 - 5.0 g/dL    AST 15 15 - 41 U/L    ALT 17 0 - 44 U/L    Alkaline Phosphatase 84 38 - 126 U/L    Total Bilirubin 0.9 0.3 - 1.2 mg/dL    GFR, Estimated 21 (L) >60 mL/min      Comment: (NOTE) Calculated using the CKD-EPI Creatinine Equation (2021)      Anion gap 10 5 - 15      Comment: Performed at [facility], [address] [place], [place] [address]  Urinalysis, Routine w reflex microscopic -Urine, Clean Catch     Status: Abnormal    Collection Time: 05/18/23 11:44 AM  Result Value Ref Range    Color, Urine YELLOW YELLOW    APPearance CLEAR CLEAR    Specific Gravity, Urine 1.015 1.005 - 1.030    pH 5.0 5.0 - 8.0    Glucose, UA NEGATIVE NEGATIVE mg/dL    Hgb urine dipstick MODERATE (A) NEGATIVE    Bilirubin Urine NEGATIVE NEGATIVE    Ketones, ur NEGATIVE NEGATIVE mg/dL    Protein, ur NEGATIVE NEGATIVE mg/dL    Nitrite NEGATIVE NEGATIVE    Leukocytes,Ua NEGATIVE NEGATIVE    RBC / HPF 11-20 0 - 5 RBC/hpf    WBC, UA 6-10 0 - 5 WBC/hpf    Bacteria, UA RARE (A) NONE SEEN    Squamous Epithelial / HPF 0-5 0 - 5 /HPF    Mucus PRESENT        Comment: Performed at [facility], [address] [place], [place] [address]      No results found for this or any previous visit (from the past 240 hour(s)). Creatinine:    Recent Labs     05/18/23 1018  CREATININE 3.06*      Xrays:  See report/chart See above   Impression/Assessment:  I drew the patient a picture.  He has 2 distal left ureteral stones 2 and 4 mm in size near the left iliac vessels.  I recommend a cystoscopy retrograde and stent placement to help pain nausea and renal function combined with hydration and medical admission pros cons were and risk discussed and need for intervention with percutaneous tube.  Risk of ureteral injury and sequelae discussed   Plan:  Patient agreed with plan

## 2023-05-18 NOTE — Consult Note (Addendum)
Urology Consult  Referring physician: Artis Delay Reason for referral: Ureteral stone  Chief Complaint: Ureteral stone  History of Present Illness: Patient came to the emergency room today with left flank pain.  He was diagnosed with 2 obstructing left ureteral stones yesterday.  He came in with pain and persistent vomiting.  He was seen yesterday at [facility].  His pain was settling down but he was still nauseated.    Patient has a history of stones.  He had a stent before.  He has passed stones but also had procedures.  He was having left-sided pain which is improved but he was having nausea and vomiting when I was examined him.  Normally voids with a reasonable flow every 2 hours and has mild nocturia.  Does not get bladder infections  White blood count 12.2 serum creatinine 3.06 with baseline being 1.6.  IMPRESSION: 1. Obstructive 2 mm and 4 mm distal left ureteral stones (interval migration of a previously visualized 2 mm left nephrolithiasis into the ureter just distal to the previously obstructive 4 mm ureterolithiasis.) Limited evaluation on this noncontrast study with question of superimposed infection. Recommend correlation with urinalysis and consideration of urologic consultation if clinically indicated. 2. Nonobstructive bilateral nephrolithiasis measuring up to 11 mm on the right and punctate on the left. 3. Indeterminate 3.1 cm heterogeneous right renal lesion. Recommend MRI or CT renal protocol further evaluation. When the patient is clinically stable and able to follow directions and hold their breath (preferably as an outpatient) further evaluation with dedicated abdominal MRI should be considered.    Past Medical History:  Diagnosis Date   Diabetes mellitus without complication (HCC)    History reviewed. No pertinent surgical history.  Medications: I have reviewed the patient's current medications. Allergies: No Known Allergies  History reviewed. No pertinent  family history. Social History:  reports that he has never smoked. He has never used smokeless tobacco. He reports that he does not currently use alcohol. He reports that he does not currently use drugs.  ROS: All systems are reviewed and negative except as noted. Rest negative  Physical Exam:  Vital signs in last 24 hours: Temp:  [97.5 F (36.4 C)-97.6 F (36.4 C)] 97.6 F (36.4 C) (05/22 1304) Pulse Rate:  [68-71] 71 (05/22 1230) Resp:  [12-16] 12 (05/22 1230) BP: (128-155)/(66-75) 128/66 (05/22 1230) SpO2:  [91 %-98 %] 91 % (05/22 1230) Weight:  [113.4 kg] 113.4 kg (05/22 1002)  Cardiovascular: Skin warm; not flushed Respiratory: Breaths quiet; no shortness of breath Abdomen: No masses Neurological: Normal sensation to touch Musculoskeletal: Normal motor function arms and legs Lymphatics: No inguinal adenopathy Skin: No rashes Genitourinary: Patient had minimal left CVA tenderness.  No abdominal tenderness.  Moderately obese  Laboratory Data:  Results for orders placed or performed during the hospital encounter of 05/18/23 (from the past 72 hour(s))  CBC with Differential     Status: Abnormal   Collection Time: 05/18/23 10:18 AM  Result Value Ref Range   WBC 12.2 (H) 4.0 - 10.5 K/uL   RBC 4.20 (L) 4.22 - 5.81 MIL/uL   Hemoglobin 11.8 (L) 13.0 - 17.0 g/dL   HCT 16.1 (L) 09.6 - 04.5 %   MCV 86.0 80.0 - 100.0 fL   MCH 28.1 26.0 - 34.0 pg   MCHC 32.7 30.0 - 36.0 g/dL   RDW 40.9 81.1 - 91.4 %   Platelets 232 150 - 400 K/uL   nRBC 0.0 0.0 - 0.2 %   Neutrophils Relative %  90 %   Neutro Abs 10.8 (H) 1.7 - 7.7 K/uL   Lymphocytes Relative 5 %   Lymphs Abs 0.6 (L) 0.7 - 4.0 K/uL   Monocytes Relative 5 %   Monocytes Absolute 0.6 0.1 - 1.0 K/uL   Eosinophils Relative 0 %   Eosinophils Absolute 0.0 0.0 - 0.5 K/uL   Basophils Relative 0 %   Basophils Absolute 0.1 0.0 - 0.1 K/uL   Immature Granulocytes 0 %   Abs Immature Granulocytes 0.05 0.00 - 0.07 K/uL    Comment:  Performed at [facility], [address], [place], [place] [address]  Comprehensive metabolic panel     Status: Abnormal   Collection Time: 05/18/23 10:18 AM  Result Value Ref Range   Sodium 136 135 - 145 mmol/L   Potassium 4.9 3.5 - 5.1 mmol/L   Chloride 104 98 - 111 mmol/L   CO2 22 22 - 32 mmol/L   Glucose, Bld 219 (H) 70 - 99 mg/dL    Comment: Glucose reference range applies only to samples taken after fasting for at least 8 hours.   BUN 48 (H) 8 - 23 mg/dL   Creatinine, Ser 6.04 (H) 0.61 - 1.24 mg/dL   Calcium 8.9 8.9 - 54.0 mg/dL   Total Protein 7.7 6.5 - 8.1 g/dL   Albumin 3.8 3.5 - 5.0 g/dL   AST 15 15 - 41 U/L   ALT 17 0 - 44 U/L   Alkaline Phosphatase 84 38 - 126 U/L   Total Bilirubin 0.9 0.3 - 1.2 mg/dL   GFR, Estimated 21 (L) >60 mL/min    Comment: (NOTE) Calculated using the CKD-EPI Creatinine Equation (2021)    Anion gap 10 5 - 15    Comment: Performed at [facility], [address], [place], [place] [address]  Urinalysis, Routine w reflex microscopic -Urine, Clean Catch     Status: Abnormal   Collection Time: 05/18/23 11:44 AM  Result Value Ref Range   Color, Urine YELLOW YELLOW   APPearance CLEAR CLEAR   Specific Gravity, Urine 1.015 1.005 - 1.030   pH 5.0 5.0 - 8.0   Glucose, UA NEGATIVE NEGATIVE mg/dL   Hgb urine dipstick MODERATE (A) NEGATIVE   Bilirubin Urine NEGATIVE NEGATIVE   Ketones, ur NEGATIVE NEGATIVE mg/dL   Protein, ur NEGATIVE NEGATIVE mg/dL   Nitrite NEGATIVE NEGATIVE   Leukocytes,Ua NEGATIVE NEGATIVE   RBC / HPF 11-20 0 - 5 RBC/hpf   WBC, UA 6-10 0 - 5 WBC/hpf   Bacteria, UA RARE (A) NONE SEEN   Squamous Epithelial / HPF 0-5 0 - 5 /HPF   Mucus PRESENT     Comment: Performed at [facility], [address] [place], [place] [address]   No results found for this or any previous visit (from the past 240 hour(s)). Creatinine: Recent Labs    05/18/23 1018  CREATININE 3.06*     Xrays: See report/chart See above  Impression/Assessment:  I drew the patient a picture.  He has 2 distal left ureteral stones 2 and 4 mm in size near the left iliac vessels.  I recommend a cystoscopy retrograde and stent placement to help pain nausea and renal function combined with hydration and medical admission pros cons were and risk discussed and need for intervention with percutaneous tube.  Risk of ureteral injury and sequelae discussed  Plan:  Patient agreed with plan  Patient is aware of renal lesion 3 cm in size and that  he will need either an MRI or CT scan with oral contrast in the future   Sharene Krikorian A Lexton Hidalgo 05/18/2023, 2:12 PM

## 2023-05-19 ENCOUNTER — Encounter (HOSPITAL_COMMUNITY): Payer: Self-pay | Admitting: Urology

## 2023-05-19 DIAGNOSIS — N201 Calculus of ureter: Secondary | ICD-10-CM | POA: Diagnosis not present

## 2023-05-19 DIAGNOSIS — N289 Disorder of kidney and ureter, unspecified: Secondary | ICD-10-CM

## 2023-05-19 DIAGNOSIS — I10 Essential (primary) hypertension: Secondary | ICD-10-CM | POA: Insufficient documentation

## 2023-05-19 DIAGNOSIS — D649 Anemia, unspecified: Secondary | ICD-10-CM | POA: Insufficient documentation

## 2023-05-19 DIAGNOSIS — Z888 Allergy status to other drugs, medicaments and biological substances status: Secondary | ICD-10-CM | POA: Diagnosis not present

## 2023-05-19 DIAGNOSIS — E785 Hyperlipidemia, unspecified: Secondary | ICD-10-CM | POA: Diagnosis present

## 2023-05-19 DIAGNOSIS — Z9641 Presence of insulin pump (external) (internal): Secondary | ICD-10-CM | POA: Diagnosis present

## 2023-05-19 DIAGNOSIS — N1832 Chronic kidney disease, stage 3b: Secondary | ICD-10-CM | POA: Insufficient documentation

## 2023-05-19 DIAGNOSIS — N179 Acute kidney failure, unspecified: Secondary | ICD-10-CM | POA: Diagnosis present

## 2023-05-19 DIAGNOSIS — D631 Anemia in chronic kidney disease: Secondary | ICD-10-CM | POA: Diagnosis present

## 2023-05-19 DIAGNOSIS — E119 Type 2 diabetes mellitus without complications: Secondary | ICD-10-CM

## 2023-05-19 DIAGNOSIS — E1122 Type 2 diabetes mellitus with diabetic chronic kidney disease: Secondary | ICD-10-CM | POA: Diagnosis present

## 2023-05-19 DIAGNOSIS — E875 Hyperkalemia: Secondary | ICD-10-CM

## 2023-05-19 DIAGNOSIS — I129 Hypertensive chronic kidney disease with stage 1 through stage 4 chronic kidney disease, or unspecified chronic kidney disease: Secondary | ICD-10-CM | POA: Diagnosis present

## 2023-05-19 DIAGNOSIS — G4730 Sleep apnea, unspecified: Secondary | ICD-10-CM | POA: Diagnosis present

## 2023-05-19 DIAGNOSIS — N202 Calculus of kidney with calculus of ureter: Secondary | ICD-10-CM | POA: Diagnosis present

## 2023-05-19 DIAGNOSIS — N200 Calculus of kidney: Secondary | ICD-10-CM

## 2023-05-19 DIAGNOSIS — Z794 Long term (current) use of insulin: Secondary | ICD-10-CM | POA: Diagnosis not present

## 2023-05-19 LAB — BASIC METABOLIC PANEL
Anion gap: 8 (ref 5–15)
BUN: 46 mg/dL — ABNORMAL HIGH (ref 8–23)
CO2: 22 mmol/L (ref 22–32)
Calcium: 8.5 mg/dL — ABNORMAL LOW (ref 8.9–10.3)
Chloride: 105 mmol/L (ref 98–111)
Creatinine, Ser: 2.31 mg/dL — ABNORMAL HIGH (ref 0.61–1.24)
GFR, Estimated: 30 mL/min — ABNORMAL LOW (ref >60)
Glucose, Bld: 318 mg/dL — ABNORMAL HIGH (ref 70–99)
Potassium: 5.2 mmol/L — ABNORMAL HIGH (ref 3.5–5.1)
Sodium: 135 mmol/L (ref 135–145)

## 2023-05-19 LAB — GLUCOSE, CAPILLARY
Glucose-Capillary: 304 mg/dL — ABNORMAL HIGH (ref 70–99)
Glucose-Capillary: 238 mg/dL — ABNORMAL HIGH (ref 70–99)
Glucose-Capillary: 271 mg/dL — ABNORMAL HIGH (ref 70–99)
Glucose-Capillary: 326 mg/dL — ABNORMAL HIGH (ref 70–99)

## 2023-05-19 LAB — CBC
HCT: 32.6 % — ABNORMAL LOW (ref 39.0–52.0)
Hemoglobin: 10.4 g/dL — ABNORMAL LOW (ref 13.0–17.0)
MCH: 28 pg (ref 26.0–34.0)
MCHC: 31.9 g/dL (ref 30.0–36.0)
MCV: 87.6 fL (ref 80.0–100.0)
Platelets: 225 10*3/uL (ref 150–400)
RBC: 3.72 MIL/uL — ABNORMAL LOW (ref 4.22–5.81)
RDW: 14 % (ref 11.5–15.5)
WBC: 12.3 10*3/uL — ABNORMAL HIGH (ref 4.0–10.5)
nRBC: 0 % (ref 0.0–0.2)

## 2023-05-19 LAB — HIV ANTIBODY (ROUTINE TESTING W REFLEX): HIV Screen 4th Generation wRfx: NONREACTIVE

## 2023-05-19 LAB — HEMOGLOBIN A1C
Hgb A1c MFr Bld: 7.9 % — ABNORMAL HIGH (ref 4.8–5.6)
Mean Plasma Glucose: 180 mg/dL

## 2023-05-19 MED ORDER — SODIUM CHLORIDE 0.9 % IV SOLN: INTRAVENOUS | Status: DC

## 2023-05-19 MED ORDER — ENOXAPARIN SODIUM 40 MG/0.4ML IJ SOSY
40.0000 mg | PREFILLED_SYRINGE | INTRAMUSCULAR | Status: DC
  Administered 2023-05-19: 40 mg via SUBCUTANEOUS
  Filled 2023-05-19: qty 0.4

## 2023-05-19 NOTE — Assessment & Plan Note (Addendum)
-   Indeterminate 3.1 cm heterogeneous right renal lesion. Recommended for MRI or CT renal protocol further evaluation -Urology plans for outpatient imaging to evaluate

## 2023-05-19 NOTE — Assessment & Plan Note (Signed)
-   Continue amlodipine ?

## 2023-05-19 NOTE — Assessment & Plan Note (Addendum)
-  Continue Lipitor °

## 2023-05-19 NOTE — Hospital Course (Signed)
Jorge Alexander is a 72 y.o. male with PMH DMII, HTN, sleep apnea, CKD3b who presented to the hospital with left-sided abdominal pain and low back pain.  He was originally evaluated at [place]. CT renal stone protocol was performed and showed 2 obstructive stones in the left ureter.  He was transferred to [facility] for urology evaluation.  He underwent left ureteral stent placement with urology on 05/18/2023. Urinalysis on admission was negative for signs of infection.

## 2023-05-19 NOTE — Assessment & Plan Note (Addendum)
-   per CT 5/22: "Obstructive 2 mm and 4 mm distal left ureteral stones (interval migration of a previously visualized 2 mm left nephrolithiasis into the ureter just distal to the previously obstructive 4 mm ureterolithiasis.)" - s/p left ureteral stent placement on 5/22 with urology - outpatient followup for definitive stone management

## 2023-05-19 NOTE — Assessment & Plan Note (Signed)
-   Baseline hemoglobin around 11 g/dL - Etiology likely in setting of anemia of chronic disease

## 2023-05-19 NOTE — Assessment & Plan Note (Addendum)
-   per CT: "Nonobstructive bilateral nephrolithiasis measuring up to 11 mm on the right and punctate on the left.

## 2023-05-19 NOTE — Assessment & Plan Note (Addendum)
-   patient has history of CKD3b. Baseline creat ~ 1.6 - 1.7, eGFR~ 40 - patient presents with increase in creat >0.3 mg/dL above baseline, creat increase >1.5x baseline presumed to have occurred within past 7 days PTA - suspected in setting of BOO and possible some prerenal - s/p L ureteral stent placement and then continued on IVF - creat improved to 1.73 at discharge

## 2023-05-19 NOTE — Inpatient Diabetes Management (Signed)
Inpatient Diabetes Program Recommendations  AACE/ADA: New Consensus Statement on Inpatient Glycemic Control (2015)  Target Ranges:  Prepandial:   less than 140 mg/dL      Peak postprandial:   less than 180 mg/dL (1-2 hours)      Critically ill patients:  140 - 180 mg/dL   Lab Results  Component Value Date   GLUCAP 271 (H) 05/19/2023   HGBA1C 7.9 (H) 05/18/2023    Review of Glycemic Control  Diabetes history: DM2 Outpatient Diabetes medications: T slim insulin pump Current orders for Inpatient glycemic control: Novolog 0-15 TID with meals and 0-5 HS  HgbA1C - 7.9%  Insulin pump - Basal - 1.26/H, Bolus ICR:7  Inpatient Diabetes Program Recommendations:    Increase Novolog to 0-20 TID with meals and 0-5 HS  Consider adding Novolog 4 units TID with meals if eating > 50%  Spoke with pt at bedside regarding his diabetes, HgbA1C of 7.9%, and his insulin pump. Pt prefers to not take any basal insulin (Semglee) d/t he will place pump back on when he discharges. Pt states he follows up with PCP regarding his diabetes management and has been successful in reducing his HgbA1C. Discussed hypoglycemia s/s and treatment. Pt appreciative of visit.   Thank you. Ailene Ards, RD, LDN, CDCES Inpatient Diabetes Coordinator [phone]

## 2023-05-19 NOTE — Progress Notes (Signed)
1 Day Post-Op Subjective: NAEON.  Patient s/p left ureteral stent.  He reports no pain today and is resting comfortably in bed at time of rounds.  Discussed case and plan.  Patient is amenable to laser lithotripsy in the near future.  Objective: Vital signs in last 24 hours: Temp:  [97.7 F (36.5 C)-98.5 F (36.9 C)] 98.2 F (36.8 C) (05/23 1350) Pulse Rate:  [66-92] 79 (05/23 1350) Resp:  [10-22] 22 (05/23 1350) BP: (121-149)/(51-66) 143/60 (05/23 1350) SpO2:  [93 %-100 %] 97 % (05/23 1350)  Intake/Output from previous day: 05/22 0701 - 05/23 0700 In: 1966 [P.O.:700; I.V.:1066; IV Piggyback:200] Out: 1200 [Urine:1195; Blood:5]  Intake/Output this shift: Total I/O In: 120 [P.O.:120] Out: 200 [Urine:200]  Physical Exam:  General: Alert and oriented CV: No cyanosis Lungs: equal chest rise Abdomen: Soft, NTND, no rebound or guarding Gu: Clear yellow urine  Lab Results: Recent Labs    05/18/23 1018 05/19/23 0530  HGB 11.8* 10.4*  HCT 36.1* 32.6*   BMET Recent Labs    05/18/23 1018 05/19/23 0530  NA 136 135  K 4.9 5.2*  CL 104 105  CO2 22 22  GLUCOSE 219* 318*  BUN 48* 46*  CREATININE 3.06* 2.31*  CALCIUM 8.9 8.5*     Studies/Results: DG C-Arm 1-60 Min-No Report  Result Date: 05/18/2023 Fluoroscopy was utilized by the requesting physician.  No radiographic interpretation.   CT Renal Stone Study  Result Date: 05/18/2023 CLINICAL DATA:  Abdominal/flank pain, stone suspected left flank pain, stone dx at  EXAM: CT ABDOMEN AND PELVIS WITHOUT CONTRAST TECHNIQUE: Multidetector CT imaging of the abdomen and pelvis was performed following the standard protocol without IV contrast. RADIATION DOSE REDUCTION: This exam was performed according to the departmental dose-optimization program which includes automated exposure control, adjustment of the mA and/or kV according to patient size and/or use of iterative reconstruction technique. COMPARISON:  CT abdomen  pelvis 05/17/2023, CT urogram 02/25/2016 FINDINGS: Lower chest: Small hiatal hernia. Four-vessel coronary calcification. Hepatobiliary: No focal liver abnormality. No gallstones, gallbladder wall thickening, or pericholecystic fluid. No biliary dilatation. Pancreas: No focal lesion. Normal pancreatic contour. No surrounding inflammatory changes. No main pancreatic ductal dilatation. Spleen: Normal in size without focal abnormality. Adrenals/Urinary Tract: No adrenal nodule bilaterally. There is a 3.1 cm heterogeneous right renal lesion with a density of 48 Hounsfield units. Several fluid density lesions within bilateral kidneys likely represent simple renal cysts. Simple renal cysts, in the absence of clinically indicated signs/symptoms, require no independent follow-up. Couple mid to distal left ureteral stones measuring 2mm and 4 mm with associated proximal moderate hydroureter and mild hydronephrosis (Interval migration of a previously visualized 2 mm left nephrolithiasis into the ureter just distal to the previously obstructive 4 mm ureterolithiasis.) Periureteral fat stranding is noted on the left. Persistent punctate left nephrolithiasis. 11 mm right nephrolithiasis. No right ureterolithiasis. No right hydroureteronephrosis. Bilateral perinephric stranding-nonspecific. Stomach/Bowel: Stomach is within normal limits. No evidence of bowel wall thickening or dilatation. Appendix appears normal. Vascular/Lymphatic: No abdominal aorta or iliac aneurysm. Moderate atherosclerotic plaque of the aorta and its branches. No abdominal, pelvic, or inguinal lymphadenopathy. Reproductive: Prostate is unremarkable. Other: No intraperitoneal free fluid. No intraperitoneal free gas. No organized fluid collection. Musculoskeletal: No abdominal wall hernia or abnormality. No suspicious lytic or blastic osseous lesions. No acute displaced fracture. Multilevel degenerative changes of the spine. IMPRESSION: 1. Obstructive 2 mm and 4  mm distal left ureteral stones (interval migration of a previously visualized 2 mm left nephrolithiasis into  the ureter just distal to the previously obstructive 4 mm ureterolithiasis.) Limited evaluation on this noncontrast study with question of superimposed infection. Recommend correlation with urinalysis and consideration of urologic consultation if clinically indicated. 2. Nonobstructive bilateral nephrolithiasis measuring up to 11 mm on the right and punctate on the left. 3. Indeterminate 3.1 cm heterogeneous right renal lesion. Recommend MRI or CT renal protocol further evaluation. When the patient is clinically stable and able to follow directions and hold their breath (preferably as an outpatient) further evaluation with dedicated abdominal MRI should be considered. 4. Small hiatal hernia. 5. Aortic Atherosclerosis (ICD10-I70.0) -including four-vessel coronary artery calcifications. Electronically Signed   By: Tish Frederickson M.D.   On: 05/18/2023 11:16    Assessment/Plan: S/p left ureteral stent with Dr. Sherron Monday on 05/18/2023.   Movement in serum creatinine overnight though still moderately elevated at 2.3.  Repeating labs in the morning. Indeterminate 3.1 cm heterogeneous right renal lesion.  Will wait to obtain imaging until his creatinine has returned to baseline.  Patient would benefit from a contrasted study more.  We can schedule this on an outpatient basis if he ends up going home before we have an opportunity to collect this. Hopefully home tomorrow.   LOS: 0 days   Elmon Kirschner, NP [facility] Pager: [phone]  05/19/2023, 2:21 PM

## 2023-05-19 NOTE — Assessment & Plan Note (Addendum)
-   in setting of AoCKD -Improved with fluids - Repeat BMP at follow-up

## 2023-05-19 NOTE — Progress Notes (Signed)
Mobility Specialist - Progress Note   05/19/23 1021  Mobility  Activity Ambulated independently in hallway  Level of Assistance Independent  Assistive Device None  Distance Ambulated (ft) 500 ft  Activity Response Tolerated well  Mobility Referral Yes  $Mobility charge 1 Mobility  Mobility Specialist Start Time (ACUTE ONLY) 1015  Mobility Specialist Stop Time (ACUTE ONLY) 1021  Mobility Specialist Time Calculation (min) (ACUTE ONLY) 6 min   Pt received in bed and agreeable to mobility. No complaints during session. Pt to bathroom after session with all needs met.     [facility]

## 2023-05-19 NOTE — Assessment & Plan Note (Addendum)
-   A1c 7.9 % on admisssion -Resume home regimen

## 2023-05-19 NOTE — Progress Notes (Signed)
Progress Note    Jorge Alexander   ZOX:[id number]  DOB: 27-Aug-1951  DOA: 05/18/2023     0 PCP: Everlean Cherry, MD  Initial CC: left abd pain  Hospital Course: Jorge Alexander is a 72 y.o. male with PMH DMII, HTN, sleep apnea, CKD3b who presented to the hospital with left-sided abdominal pain and low back pain.  He was originally evaluated at [place]. CT renal stone protocol was performed and showed 2 obstructive stones in the left ureter.  He was transferred to [facility] for urology evaluation.  He underwent left ureteral stent placement with urology on 05/18/2023. Urinalysis on admission was negative for signs of infection.  Interval History:  Seen this afternoon resting in bed with wife present bedside.  Abdominal pain has resolved and has no further back pain either.  Voiding well and denies any blood in urine.  Assessment and Plan: * Ureterolithiasis - per CT 5/22: "Obstructive 2 mm and 4 mm distal left ureteral stones (interval migration of a previously visualized 2 mm left nephrolithiasis into the ureter just distal to the previously obstructive 4 mm ureterolithiasis.)" - s/p left ureteral stent placement on 5/22 with urology - outpatient followup for definitive stone management   Acute renal failure superimposed on stage 3b chronic kidney disease (HCC) - patient has history of CKD3b. Baseline creat ~ 1.6 - 1.7, eGFR~ 40 - patient presents with increase in creat >0.3 mg/dL above baseline, creat increase >1.5x baseline presumed to have occurred within past 7 days PTA - suspected in setting of BOO and possible some prerenal - s/p L ureteral stent placement and now on IVF - creat 2.3 this morning - continue IVF and repeat creat in am   Hyperkalemia - in setting of AoCKD - mild, change IVF - repeat BMP in am  Renal lesion - Indeterminate 3.1 cm heterogeneous right renal lesion. Recommended for MRI or CT renal protocol further evaluation  Normocytic anemia - Baseline  hemoglobin around 11 g/dL - Etiology likely in setting of anemia of chronic disease  HLD (hyperlipidemia) - Continue Lipitor  HTN (hypertension) - Continue amlodipine  DMII (diabetes mellitus, type 2) (HCC) - A1c 7.9 % on admisssion - continue SSI and CBG monitoring  Nephrolithiasis - per CT: "Nonobstructive bilateral nephrolithiasis measuring up to 11 mm on the right and punctate on the left.   Old records reviewed in assessment of this patient  Antimicrobials:   DVT prophylaxis:  enoxaparin (LOVENOX) injection 30 mg Start: 05/19/23 1700 SCDs Start: 05/18/23 1754   Code Status:   Code Status: Full Code  Mobility Assessment (last 72 hours)     Mobility Assessment     Row Name 05/19/23 0725 05/18/23 2300 05/18/23 1800       Does patient have an order for bedrest or is patient medically unstable No - Continue assessment No - Continue assessment No - Continue assessment     What is the highest level of mobility based on the progressive mobility assessment? Level 6 (Walks independently in room and hall) - Balance while walking in room without assist - Complete Level 5 (Walks with assist in room/hall) - Balance while stepping forward/back and can walk in room with assist - Complete Level 5 (Walks with assist in room/hall) - Balance while stepping forward/back and can walk in room with assist - Complete              Barriers to discharge: None Disposition Plan: Home Status is: Inpatient  Objective: Blood pressure Marland Kitchen)  127/57, pulse 79, temperature 98.2 F (36.8 C), temperature source Oral, resp. rate (!) 22, height 5\' 7"  (1.702 m), weight 113.4 kg, SpO2 99 %.  Examination:  Physical Exam Constitutional:      General: He is not in acute distress.    Appearance: Normal appearance.  HENT:     Head: Normocephalic and atraumatic.     Mouth/Throat:     Mouth: Mucous membranes are moist.  Eyes:     Extraocular Movements: Extraocular movements intact.  Cardiovascular:      Rate and Rhythm: Normal rate and regular rhythm.  Pulmonary:     Effort: Pulmonary effort is normal. No respiratory distress.     Breath sounds: Normal breath sounds. No wheezing.  Abdominal:     General: Bowel sounds are normal. There is no distension.     Palpations: Abdomen is soft.     Tenderness: There is no abdominal tenderness.  Musculoskeletal:        General: Normal range of motion.     Cervical back: Normal range of motion and neck supple.  Skin:    General: Skin is warm and dry.  Neurological:     General: No focal deficit present.     Mental Status: He is alert.  Psychiatric:        Mood and Affect: Mood normal.        Behavior: Behavior normal.      Consultants:  Urology  Procedures:  05/18/2023: Left ureteral stent placement  Data Reviewed: Results for orders placed or performed during the hospital encounter of 05/18/23 (from the past 24 hour(s))  Glucose, capillary     Status: Abnormal   Collection Time: 05/18/23  3:57 PM  Result Value Ref Range   Glucose-Capillary 170 (H) 70 - 99 mg/dL   Comment 1 Notify RN    Comment 2 Document in Chart    Comment 3 Call MD NNP PA CNM   Glucose, capillary     Status: Abnormal   Collection Time: 05/18/23  5:00 PM  Result Value Ref Range   Glucose-Capillary 167 (H) 70 - 99 mg/dL  Hemoglobin Z6X     Status: Abnormal   Collection Time: 05/18/23  5:05 PM  Result Value Ref Range   Hgb A1c MFr Bld 7.9 (H) 4.8 - 5.6 %   Mean Plasma Glucose 180 mg/dL  Glucose, capillary     Status: Abnormal   Collection Time: 05/18/23  5:43 PM  Result Value Ref Range   Glucose-Capillary 194 (H) 70 - 99 mg/dL  Glucose, capillary     Status: Abnormal   Collection Time: 05/18/23  8:52 PM  Result Value Ref Range   Glucose-Capillary 261 (H) 70 - 99 mg/dL  Basic metabolic panel     Status: Abnormal   Collection Time: 05/19/23  5:30 AM  Result Value Ref Range   Sodium 135 135 - 145 mmol/L   Potassium 5.2 (H) 3.5 - 5.1 mmol/L   Chloride  105 98 - 111 mmol/L   CO2 22 22 - 32 mmol/L   Glucose, Bld 318 (H) 70 - 99 mg/dL   BUN 46 (H) 8 - 23 mg/dL   Creatinine, Ser 0.96 (H) 0.61 - 1.24 mg/dL   Calcium 8.5 (L) 8.9 - 10.3 mg/dL   GFR, Estimated 30 (L) >60 mL/min   Anion gap 8 5 - 15  CBC     Status: Abnormal   Collection Time: 05/19/23  5:30 AM  Result Value Ref Range  WBC 12.3 (H) 4.0 - 10.5 K/uL   RBC 3.72 (L) 4.22 - 5.81 MIL/uL   Hemoglobin 10.4 (L) 13.0 - 17.0 g/dL   HCT 16.1 (L) 09.6 - 04.5 %   MCV 87.6 80.0 - 100.0 fL   MCH 28.0 26.0 - 34.0 pg   MCHC 31.9 30.0 - 36.0 g/dL   RDW 40.9 81.1 - 91.4 %   Platelets 225 150 - 400 K/uL   nRBC 0.0 0.0 - 0.2 %  HIV Antibody (routine testing w rflx)     Status: None   Collection Time: 05/19/23  5:30 AM  Result Value Ref Range   HIV Screen 4th Generation wRfx Non Reactive Non Reactive  Glucose, capillary     Status: Abnormal   Collection Time: 05/19/23  7:31 AM  Result Value Ref Range   Glucose-Capillary 304 (H) 70 - 99 mg/dL  Glucose, capillary     Status: Abnormal   Collection Time: 05/19/23 11:22 AM  Result Value Ref Range   Glucose-Capillary 238 (H) 70 - 99 mg/dL    I have reviewed pertinent nursing notes, vitals, labs, and images as necessary. I have ordered labwork to follow up on as indicated.  I have reviewed the last notes from staff over past 24 hours. I have discussed patient's care plan and test results with nursing staff, CM/SW, and other staff as appropriate.  Time spent: Greater than 50% of the 55 minute visit was spent in counseling/coordination of care for the patient as laid out in the A&P.   LOS: 0 days   Lewie Chamber, MD [facility] 05/19/2023, 1:44 PM

## 2023-05-20 DIAGNOSIS — E875 Hyperkalemia: Secondary | ICD-10-CM | POA: Diagnosis not present

## 2023-05-20 DIAGNOSIS — N201 Calculus of ureter: Secondary | ICD-10-CM | POA: Diagnosis not present

## 2023-05-20 DIAGNOSIS — N179 Acute kidney failure, unspecified: Secondary | ICD-10-CM | POA: Diagnosis not present

## 2023-05-20 DIAGNOSIS — N1832 Chronic kidney disease, stage 3b: Secondary | ICD-10-CM | POA: Diagnosis not present

## 2023-05-20 LAB — BASIC METABOLIC PANEL
Anion gap: 6 (ref 5–15)
BUN: 42 mg/dL — ABNORMAL HIGH (ref 8–23)
CO2: 23 mmol/L (ref 22–32)
Calcium: 8.7 mg/dL — ABNORMAL LOW (ref 8.9–10.3)
Chloride: 107 mmol/L (ref 98–111)
Creatinine, Ser: 1.73 mg/dL — ABNORMAL HIGH (ref 0.61–1.24)
GFR, Estimated: 42 mL/min — ABNORMAL LOW (ref >60)
Glucose, Bld: 257 mg/dL — ABNORMAL HIGH (ref 70–99)
Potassium: 5.1 mmol/L (ref 3.5–5.1)
Sodium: 136 mmol/L (ref 135–145)

## 2023-05-20 LAB — CBC WITH DIFFERENTIAL/PLATELET
Abs Immature Granulocytes: 0.04 10*3/uL (ref 0.00–0.07)
Basophils Absolute: 0.1 10*3/uL (ref 0.0–0.1)
Basophils Relative: 0 %
Eosinophils Absolute: 0.1 10*3/uL (ref 0.0–0.5)
Eosinophils Relative: 1 %
HCT: 34.3 % — ABNORMAL LOW (ref 39.0–52.0)
Hemoglobin: 10.7 g/dL — ABNORMAL LOW (ref 13.0–17.0)
Immature Granulocytes: 0 %
Lymphocytes Relative: 12 %
Lymphs Abs: 1.4 10*3/uL (ref 0.7–4.0)
MCH: 27.9 pg (ref 26.0–34.0)
MCHC: 31.2 g/dL (ref 30.0–36.0)
MCV: 89.6 fL (ref 80.0–100.0)
Monocytes Absolute: 0.9 10*3/uL (ref 0.1–1.0)
Monocytes Relative: 8 %
Neutro Abs: 9.7 10*3/uL — ABNORMAL HIGH (ref 1.7–7.7)
Neutrophils Relative %: 79 %
Platelets: 227 10*3/uL (ref 150–400)
RBC: 3.83 MIL/uL — ABNORMAL LOW (ref 4.22–5.81)
RDW: 14 % (ref 11.5–15.5)
WBC: 12.2 10*3/uL — ABNORMAL HIGH (ref 4.0–10.5)
nRBC: 0 % (ref 0.0–0.2)

## 2023-05-20 LAB — MAGNESIUM: Magnesium: 1.5 mg/dL — ABNORMAL LOW (ref 1.7–2.4)

## 2023-05-20 LAB — GLUCOSE, CAPILLARY: Glucose-Capillary: 194 mg/dL — ABNORMAL HIGH (ref 70–99)

## 2023-05-20 MED ORDER — MAGNESIUM SULFATE 2 GM/50ML IV SOLN
2.0000 g | Freq: Once | INTRAVENOUS | Status: AC
  Administered 2023-05-20: 2 g via INTRAVENOUS
  Filled 2023-05-20: qty 50

## 2023-05-20 NOTE — Discharge Summary (Signed)
Physician Discharge Summary   LOIS DENTINGER BJY:[id number] DOB: Feb 20, 1951 DOA: 05/18/2023  PCP: Everlean Cherry, MD  Admit date: 05/18/2023 Discharge date: 05/20/2023   Admitted From: Home Disposition:  Home Discharging physician: Lewie Chamber, MD Barriers to discharge: none  Recommendations at discharge: Follow-up with urology. Right renal lesion needing MRI or CT renal protocol.  Urology planning for outpatient imaging study BMP to followup creatinine and K  Discharge Condition: stable CODE STATUS: Full Diet recommendation:  Diet Orders (From admission, onward)     Start     Ordered   05/20/23 0000  Diet Carb Modified        05/20/23 1023   05/18/23 1748  Diet Carb Modified Fluid consistency: Thin; Room service appropriate? Yes  Diet effective now       Question Answer Comment  Diet-HS Snack? Nothing   Calorie Level Medium 1600-2000   Fluid consistency: Thin   Room service appropriate? Yes      05/18/23 1747            Hospital Course: Jorge Alexander is a 72 y.o. male with PMH DMII, HTN, sleep apnea, CKD3b who presented to the hospital with left-sided abdominal pain and low back pain.  He was originally evaluated at [place]. CT renal stone protocol was performed and showed 2 obstructive stones in the left ureter.  He was transferred to [facility] for urology evaluation.  He underwent left ureteral stent placement with urology on 05/18/2023. Urinalysis on admission was negative for signs of infection.  Assessment and Plan: * Ureterolithiasis - per CT 5/22: "Obstructive 2 mm and 4 mm distal left ureteral stones (interval migration of a previously visualized 2 mm left nephrolithiasis into the ureter just distal to the previously obstructive 4 mm ureterolithiasis.)" - s/p left ureteral stent placement on 5/22 with urology - outpatient followup for definitive stone management   Acute renal failure superimposed on stage 3b chronic kidney disease (HCC) - patient  has history of CKD3b. Baseline creat ~ 1.6 - 1.7, eGFR~ 40 - patient presents with increase in creat >0.3 mg/dL above baseline, creat increase >1.5x baseline presumed to have occurred within past 7 days PTA - suspected in setting of BOO and possible some prerenal - s/p L ureteral stent placement and then continued on IVF - creat improved to 1.73 at discharge  Hyperkalemia-resolved as of 05/20/2023 - in setting of AoCKD -Improved with fluids - Repeat BMP at follow-up  Renal lesion - Indeterminate 3.1 cm heterogeneous right renal lesion. Recommended for MRI or CT renal protocol further evaluation -Urology plans for outpatient imaging to evaluate  Normocytic anemia - Baseline hemoglobin around 11 g/dL - Etiology likely in setting of anemia of chronic disease  HLD (hyperlipidemia) - Continue Lipitor  HTN (hypertension) - Continue amlodipine  DMII (diabetes mellitus, type 2) (HCC) - A1c 7.9 % on admisssion -Resume home regimen  Nephrolithiasis - per CT: "Nonobstructive bilateral nephrolithiasis measuring up to 11 mm on the right and punctate on the left.    The patient's chronic medical conditions were treated accordingly per the patient's home medication regimen except as noted.  On day of discharge, patient was felt deemed stable for discharge. Patient/family member advised to call PCP or come back to ER if needed.   Principal Diagnosis: Ureterolithiasis  Discharge Diagnoses: Active Hospital Problems   Diagnosis Date Noted   Ureterolithiasis 05/18/2023    Priority: 1.   Acute renal failure superimposed on stage 3b chronic kidney disease (HCC) 05/19/2023  Priority: 2.   Renal lesion 05/19/2023    Priority: 4.   Nephrolithiasis 05/19/2023   DMII (diabetes mellitus, type 2) (HCC) 05/19/2023   HTN (hypertension) 05/19/2023   Sleep apnea 05/19/2023   HLD (hyperlipidemia) 05/19/2023   Normocytic anemia 05/19/2023    Resolved Hospital Problems   Diagnosis Date Noted Date  Resolved   Hyperkalemia 05/19/2023 05/20/2023    Priority: 3.     Discharge Instructions     Diet Carb Modified   Complete by: As directed    Increase activity slowly   Complete by: As directed       Allergies as of 05/20/2023       Reactions   Liraglutide Nausea Only, Other (See Comments)   GI Intolerance (Victoza or Saxenda)        Medication List     TAKE these medications    amLODipine 10 MG tablet Commonly known as: NORVASC Take 10 mg by mouth daily.   aspirin EC 81 MG tablet Take 81 mg by mouth in the morning. Swallow whole.   atorvastatin 40 MG tablet Commonly known as: LIPITOR Take 40 mg by mouth at bedtime.   FreeStyle Libre 2 Sensor Misc Inject 1 Device into the skin every 14 (fourteen) days.   Gvoke HypoPen 1-Pack 0.5 MG/0.1ML Soaj Generic drug: Glucagon Inject 0.5 mg into the skin once as needed (as directed for diabetic emergency).   Insulin Infusion Pump Devi Inject 1 Device into the skin continuous.   lisinopril 20 MG tablet Commonly known as: ZESTRIL Take 20 mg by mouth at bedtime.   NovoLOG 100 UNIT/ML injection Generic drug: insulin aspart Inject 0-100 Units into the skin See admin instructions. 0-100 units, PER INSULIN PUMP, every 3 days   ondansetron 4 MG disintegrating tablet Commonly known as: ZOFRAN-ODT Take 4 mg by mouth every 8 (eight) hours as needed for nausea or vomiting (dissolve orally).   oxyCODONE 5 MG immediate release tablet Commonly known as: Oxy IR/ROXICODONE Take 5 mg by mouth every 6 (six) hours as needed (for pain).   triamcinolone cream 0.5 % Commonly known as: KENALOG Apply 1 Application topically 2 (two) times daily as needed (to affected areas of the legs and nose).   Vitamin D3 1000 units Caps Take 1,000 Units by mouth daily with breakfast.        Allergies  Allergen Reactions   Liraglutide Nausea Only and Other (See Comments)    GI Intolerance (Victoza or Saxenda)     Consultations: Urology  Procedures: 05/18/2023: Left ureteral stent placement   Discharge Exam: BP 130/66 (BP Location: Left Arm)   Pulse (!) 56   Temp 97.8 F (36.6 C) (Oral)   Resp 18   Ht 5\' 7"  (1.702 m)   Wt 113.4 kg   SpO2 98%   BMI 39.16 kg/m  Physical Exam Constitutional:      General: He is not in acute distress.    Appearance: Normal appearance.  HENT:     Head: Normocephalic and atraumatic.     Mouth/Throat:     Mouth: Mucous membranes are moist.  Eyes:     Extraocular Movements: Extraocular movements intact.  Cardiovascular:     Rate and Rhythm: Normal rate and regular rhythm.  Pulmonary:     Effort: Pulmonary effort is normal. No respiratory distress.     Breath sounds: Normal breath sounds. No wheezing.  Abdominal:     General: Bowel sounds are normal. There is no distension.     Palpations:  Abdomen is soft.     Tenderness: There is no abdominal tenderness.  Musculoskeletal:        General: Normal range of motion.     Cervical back: Normal range of motion and neck supple.  Skin:    General: Skin is warm and dry.  Neurological:     General: No focal deficit present.     Mental Status: He is alert.  Psychiatric:        Mood and Affect: Mood normal.        Behavior: Behavior normal.      The results of significant diagnostics from this hospitalization (including imaging, microbiology, ancillary and laboratory) are listed below for reference.   Microbiology: No results found for this or any previous visit (from the past 240 hour(s)).   Labs: BNP (last 3 results) No results for input(s): "BNP" in the last 8760 hours. Basic Metabolic Panel: Recent Labs  Lab 05/18/23 1018 05/19/23 0530 05/20/23 0512  NA 136 135 136  K 4.9 5.2* 5.1  CL 104 105 107  CO2 22 22 23   GLUCOSE 219* 318* 257*  BUN 48* 46* 42*  CREATININE 3.06* 2.31* 1.73*  CALCIUM 8.9 8.5* 8.7*  MG  --   --  1.5*   Liver Function Tests: Recent Labs  Lab 05/18/23 1018  AST  15  ALT 17  ALKPHOS 84  BILITOT 0.9  PROT 7.7  ALBUMIN 3.8   No results for input(s): "LIPASE", "AMYLASE" in the last 168 hours. No results for input(s): "AMMONIA" in the last 168 hours. CBC: Recent Labs  Lab 05/18/23 1018 05/19/23 0530 05/20/23 0512  WBC 12.2* 12.3* 12.2*  NEUTROABS 10.8*  --  9.7*  HGB 11.8* 10.4* 10.7*  HCT 36.1* 32.6* 34.3*  MCV 86.0 87.6 89.6  PLT 232 225 227   Cardiac Enzymes: No results for input(s): "CKTOTAL", "CKMB", "CKMBINDEX", "TROPONINI" in the last 168 hours. BNP: Invalid input(s): "POCBNP" CBG: Recent Labs  Lab 05/19/23 0731 05/19/23 1122 05/19/23 1645 05/19/23 2119 05/20/23 0727  GLUCAP 304* 238* 271* 326* 194*   D-Dimer No results for input(s): "DDIMER" in the last 72 hours. Hgb A1c Recent Labs    05/18/23 1705  HGBA1C 7.9*   Lipid Profile No results for input(s): "CHOL", "HDL", "LDLCALC", "TRIG", "CHOLHDL", "LDLDIRECT" in the last 72 hours. Thyroid function studies No results for input(s): "TSH", "T4TOTAL", "T3FREE", "THYROIDAB" in the last 72 hours.  Invalid input(s): "FREET3" Anemia work up No results for input(s): "VITAMINB12", "FOLATE", "FERRITIN", "TIBC", "IRON", "RETICCTPCT" in the last 72 hours. Urinalysis    Component Value Date/Time   COLORURINE YELLOW 05/18/2023 1144   APPEARANCEUR CLEAR 05/18/2023 1144   LABSPEC 1.015 05/18/2023 1144   PHURINE 5.0 05/18/2023 1144   GLUCOSEU NEGATIVE 05/18/2023 1144   HGBUR MODERATE (A) 05/18/2023 1144   BILIRUBINUR NEGATIVE 05/18/2023 1144   KETONESUR NEGATIVE 05/18/2023 1144   PROTEINUR NEGATIVE 05/18/2023 1144   NITRITE NEGATIVE 05/18/2023 1144   LEUKOCYTESUR NEGATIVE 05/18/2023 1144   Sepsis Labs Recent Labs  Lab 05/18/23 1018 05/19/23 0530 05/20/23 0512  WBC 12.2* 12.3* 12.2*   Microbiology No results found for this or any previous visit (from the past 240 hour(s)).  Procedures/Studies: DG C-Arm 1-60 Min-No Report  Result Date: 05/18/2023 Fluoroscopy was  utilized by the requesting physician.  No radiographic interpretation.   CT Renal Stone Study  Result Date: 05/18/2023 CLINICAL DATA:  Abdominal/flank pain, stone suspected left flank pain, stone dx at [place] EXAM: CT ABDOMEN AND PELVIS WITHOUT CONTRAST  TECHNIQUE: Multidetector CT imaging of the abdomen and pelvis was performed following the standard protocol without IV contrast. RADIATION DOSE REDUCTION: This exam was performed according to the departmental dose-optimization program which includes automated exposure control, adjustment of the mA and/or kV according to patient size and/or use of iterative reconstruction technique. COMPARISON:  CT abdomen pelvis 05/17/2023, CT urogram 02/25/2016 FINDINGS: Lower chest: Small hiatal hernia. Four-vessel coronary calcification. Hepatobiliary: No focal liver abnormality. No gallstones, gallbladder wall thickening, or pericholecystic fluid. No biliary dilatation. Pancreas: No focal lesion. Normal pancreatic contour. No surrounding inflammatory changes. No main pancreatic ductal dilatation. Spleen: Normal in size without focal abnormality. Adrenals/Urinary Tract: No adrenal nodule bilaterally. There is a 3.1 cm heterogeneous right renal lesion with a density of 48 Hounsfield units. Several fluid density lesions within bilateral kidneys likely represent simple renal cysts. Simple renal cysts, in the absence of clinically indicated signs/symptoms, require no independent follow-up. Couple mid to distal left ureteral stones measuring 2mm and 4 mm with associated proximal moderate hydroureter and mild hydronephrosis (Interval migration of a previously visualized 2 mm left nephrolithiasis into the ureter just distal to the previously obstructive 4 mm ureterolithiasis.) Periureteral fat stranding is noted on the left. Persistent punctate left nephrolithiasis. 11 mm right nephrolithiasis. No right ureterolithiasis. No right hydroureteronephrosis. Bilateral perinephric  stranding-nonspecific. Stomach/Bowel: Stomach is within normal limits. No evidence of bowel wall thickening or dilatation. Appendix appears normal. Vascular/Lymphatic: No abdominal aorta or iliac aneurysm. Moderate atherosclerotic plaque of the aorta and its branches. No abdominal, pelvic, or inguinal lymphadenopathy. Reproductive: Prostate is unremarkable. Other: No intraperitoneal free fluid. No intraperitoneal free gas. No organized fluid collection. Musculoskeletal: No abdominal wall hernia or abnormality. No suspicious lytic or blastic osseous lesions. No acute displaced fracture. Multilevel degenerative changes of the spine. IMPRESSION: 1. Obstructive 2 mm and 4 mm distal left ureteral stones (interval migration of a previously visualized 2 mm left nephrolithiasis into the ureter just distal to the previously obstructive 4 mm ureterolithiasis.) Limited evaluation on this noncontrast study with question of superimposed infection. Recommend correlation with urinalysis and consideration of urologic consultation if clinically indicated. 2. Nonobstructive bilateral nephrolithiasis measuring up to 11 mm on the right and punctate on the left. 3. Indeterminate 3.1 cm heterogeneous right renal lesion. Recommend MRI or CT renal protocol further evaluation. When the patient is clinically stable and able to follow directions and hold their breath (preferably as an outpatient) further evaluation with dedicated abdominal MRI should be considered. 4. Small hiatal hernia. 5. Aortic Atherosclerosis (ICD10-I70.0) -including four-vessel coronary artery calcifications. Electronically Signed   By: Tish Frederickson M.D.   On: 05/18/2023 11:16     Time coordinating discharge: Over 30 minutes    Lewie Chamber, MD  [facility] 05/20/2023, 3:08 PM

## 2023-05-20 NOTE — Progress Notes (Signed)
  Transition of Care [facility]) Screening Note   Patient Details  Name: MURRIEL BECKEN Date of Birth: [birth year]   Transition of Care [facility]) CM/SW Contact:    Lanier Clam, RN Phone Number: 05/20/2023, 10:41 AM    Transition of Care Department [facility]) has reviewed patient and no TOC needs have been identified at this time. We will continue to monitor patient advancement through interdisciplinary progression rounds. If new patient transition needs arise, please place a TOC consult.

## 2023-05-20 NOTE — Progress Notes (Signed)
2 Days Post-Op Subjective: NAEON.  Patient s/p left ureteral stent.  He reports no pain today and is resting comfortably in bed at time of rounds.  Discussed case and plan.  Patient is amenable to laser lithotripsy in the near future.  Objective: Vital signs in last 24 hours: Temp:  [97.4 F (36.3 C)-98.7 F (37.1 C)] 97.8 F (36.6 C) (05/24 0841) Pulse Rate:  [56-79] 56 (05/24 0841) Resp:  [18-22] 18 (05/24 0425) BP: (130-143)/(49-66) 130/66 (05/24 0841) SpO2:  [96 %-98 %] 98 % (05/24 0841)  Intake/Output from previous day: 05/23 0701 - 05/24 0700 In: 2407.3 [P.O.:720; I.V.:1687.3] Out: 1770 [Urine:1770]  Intake/Output this shift: Total I/O In: -  Out: 450 [Urine:450]  Physical Exam:  General: Alert and oriented CV: No cyanosis Lungs: equal chest rise Abdomen: Soft, NTND, no rebound or guarding Gu: Clear yellow urine  Lab Results: Recent Labs    05/18/23 1018 05/19/23 0530 05/20/23 0512  HGB 11.8* 10.4* 10.7*  HCT 36.1* 32.6* 34.3*   BMET Recent Labs    05/19/23 0530 05/20/23 0512  NA 135 136  K 5.2* 5.1  CL 105 107  CO2 22 23  GLUCOSE 318* 257*  BUN 46* 42*  CREATININE 2.31* 1.73*  CALCIUM 8.5* 8.7*     Studies/Results: DG C-Arm 1-60 Min-No Report  Result Date: 05/18/2023 Fluoroscopy was utilized by the requesting physician.  No radiographic interpretation.   CT Renal Stone Study  Result Date: 05/18/2023 CLINICAL DATA:  Abdominal/flank pain, stone suspected left flank pain, stone dx at [place] EXAM: CT ABDOMEN AND PELVIS WITHOUT CONTRAST TECHNIQUE: Multidetector CT imaging of the abdomen and pelvis was performed following the standard protocol without IV contrast. RADIATION DOSE REDUCTION: This exam was performed according to the departmental dose-optimization program which includes automated exposure control, adjustment of the mA and/or kV according to patient size and/or use of iterative reconstruction technique. COMPARISON:  CT abdomen pelvis  05/17/2023, CT urogram 02/25/2016 FINDINGS: Lower chest: Small hiatal hernia. Four-vessel coronary calcification. Hepatobiliary: No focal liver abnormality. No gallstones, gallbladder wall thickening, or pericholecystic fluid. No biliary dilatation. Pancreas: No focal lesion. Normal pancreatic contour. No surrounding inflammatory changes. No main pancreatic ductal dilatation. Spleen: Normal in size without focal abnormality. Adrenals/Urinary Tract: No adrenal nodule bilaterally. There is a 3.1 cm heterogeneous right renal lesion with a density of 48 Hounsfield units. Several fluid density lesions within bilateral kidneys likely represent simple renal cysts. Simple renal cysts, in the absence of clinically indicated signs/symptoms, require no independent follow-up. Couple mid to distal left ureteral stones measuring 2mm and 4 mm with associated proximal moderate hydroureter and mild hydronephrosis (Interval migration of a previously visualized 2 mm left nephrolithiasis into the ureter just distal to the previously obstructive 4 mm ureterolithiasis.) Periureteral fat stranding is noted on the left. Persistent punctate left nephrolithiasis. 11 mm right nephrolithiasis. No right ureterolithiasis. No right hydroureteronephrosis. Bilateral perinephric stranding-nonspecific. Stomach/Bowel: Stomach is within normal limits. No evidence of bowel wall thickening or dilatation. Appendix appears normal. Vascular/Lymphatic: No abdominal aorta or iliac aneurysm. Moderate atherosclerotic plaque of the aorta and its branches. No abdominal, pelvic, or inguinal lymphadenopathy. Reproductive: Prostate is unremarkable. Other: No intraperitoneal free fluid. No intraperitoneal free gas. No organized fluid collection. Musculoskeletal: No abdominal wall hernia or abnormality. No suspicious lytic or blastic osseous lesions. No acute displaced fracture. Multilevel degenerative changes of the spine. IMPRESSION: 1. Obstructive 2 mm and 4 mm  distal left ureteral stones (interval migration of a previously visualized 2 mm left nephrolithiasis  into the ureter just distal to the previously obstructive 4 mm ureterolithiasis.) Limited evaluation on this noncontrast study with question of superimposed infection. Recommend correlation with urinalysis and consideration of urologic consultation if clinically indicated. 2. Nonobstructive bilateral nephrolithiasis measuring up to 11 mm on the right and punctate on the left. 3. Indeterminate 3.1 cm heterogeneous right renal lesion. Recommend MRI or CT renal protocol further evaluation. When the patient is clinically stable and able to follow directions and hold their breath (preferably as an outpatient) further evaluation with dedicated abdominal MRI should be considered. 4. Small hiatal hernia. 5. Aortic Atherosclerosis (ICD10-I70.0) -including four-vessel coronary artery calcifications. Electronically Signed   By: Tish Frederickson M.D.   On: 05/18/2023 11:16    Assessment/Plan: S/p left ureteral stent with Dr. Sherron Monday on 05/18/2023.   Interval improvement in serum creatinine 2.3->1.7 today. Indeterminate 3.1 cm heterogeneous right renal lesion.  Will wait to obtain imaging until his creatinine has returned to baseline.  Patient would benefit from a contrasted study more.  We can schedule this on an outpatient basis. Schedulers have already contacted and have him an appointment Discharge from urologic perspective.   LOS: 1 day   Jorge Kirschner, NP [facility] Pager: [phone]  05/20/2023, 9:14 AM

## 2023-05-20 NOTE — Plan of Care (Signed)

## 2023-05-20 NOTE — Progress Notes (Addendum)
The patient is alert, oriented x4, ambulatory without assistance. No change from am assessment. Discharge instructions were reviewed. Questions, concerns were denied at this time.

## 2023-06-11 ENCOUNTER — Encounter (HOSPITAL_COMMUNITY): Payer: Self-pay

## 2023-06-11 ENCOUNTER — Emergency Department (HOSPITAL_COMMUNITY)
Admission: EM | Admit: 2023-06-11 | Discharge: 2023-06-11 | Disposition: A | Discharge status: Home / Self Care | Payer: Medicare Other | Attending: Emergency Medicine | Admitting: Emergency Medicine

## 2023-06-11 ENCOUNTER — Other Ambulatory Visit: Payer: Self-pay

## 2023-06-11 DIAGNOSIS — I10 Essential (primary) hypertension: Secondary | ICD-10-CM | POA: Diagnosis not present

## 2023-06-11 DIAGNOSIS — E119 Type 2 diabetes mellitus without complications: Secondary | ICD-10-CM | POA: Insufficient documentation

## 2023-06-11 DIAGNOSIS — Z794 Long term (current) use of insulin: Secondary | ICD-10-CM | POA: Diagnosis not present

## 2023-06-11 DIAGNOSIS — S01112A Laceration without foreign body of left eyelid and periocular area, initial encounter: Secondary | ICD-10-CM | POA: Diagnosis present

## 2023-06-11 DIAGNOSIS — Z23 Encounter for immunization: Secondary | ICD-10-CM | POA: Insufficient documentation

## 2023-06-11 DIAGNOSIS — Z79899 Other long term (current) drug therapy: Secondary | ICD-10-CM | POA: Diagnosis not present

## 2023-06-11 DIAGNOSIS — Z7982 Long term (current) use of aspirin: Secondary | ICD-10-CM | POA: Diagnosis not present

## 2023-06-11 DIAGNOSIS — W228XXA Striking against or struck by other objects, initial encounter: Secondary | ICD-10-CM | POA: Insufficient documentation

## 2023-06-11 MED ORDER — TETANUS-DIPHTH-ACELL PERTUSSIS 5-2.5-18.5 LF-MCG/0.5 IM SUSY
0.5000 mL | PREFILLED_SYRINGE | Freq: Once | INTRAMUSCULAR | Status: AC
  Administered 2023-06-11: 0.5 mL via INTRAMUSCULAR
  Filled 2023-06-11: qty 0.5

## 2023-06-11 MED ORDER — LIDOCAINE HCL (PF) 1 % IJ SOLN
5.0000 mL | Freq: Once | INTRAMUSCULAR | Status: AC
  Administered 2023-06-11: 5 mL via INTRADERMAL
  Filled 2023-06-11: qty 30

## 2023-06-11 NOTE — ED Provider Notes (Signed)
[place] EMERGENCY DEPARTMENT AT [facility] Provider Note   CSN: [id number] Arrival date & time: 06/11/23  0343     History  Chief Complaint  Patient presents with   Facial Laceration    Jorge Alexander is a 72 y.o. male.  The history is provided by the patient and medical records.   72 year old male with history of diabetes, hypertension, hyperlipidemia, presenting to the ED with laceration to left eyebrow.  He rolled out of bed earlier this morning and struck his face on the corner of the nightstand.  There was no loss of consciousness.  He did sustain laceration to left eyebrow.  He feels fine otherwise without any headache, dizziness, confusion, nausea, vomiting, or weakness.  He takes daily aspirin but no formal anticoagulation.  Unsure of last tetanus.  Home Medications Prior to Admission medications   Medication Sig Start Date End Date Taking? Authorizing Provider  amLODipine (NORVASC) 10 MG tablet Take 10 mg by mouth daily.    [provider]  aspirin EC 81 MG tablet Take 81 mg by mouth in the morning. Swallow whole.    [provider]  atorvastatin (LIPITOR) 40 MG tablet Take 40 mg by mouth at bedtime.    [provider]  Cholecalciferol (VITAMIN D3) 1000 units CAPS Take 1,000 Units by mouth daily with breakfast.    [provider]  Continuous Glucose Sensor (FREESTYLE LIBRE 2 SENSOR) MISC Inject 1 Device into the skin every 14 (fourteen) days. 10/18/22   [provider]  GVOKE HYPOPEN 1-PACK 0.5 MG/0.1ML SOAJ Inject 0.5 mg into the skin once as needed (as directed for diabetic emergency).    [provider]  Insulin Infusion Pump DEVI Inject 1 Device into the skin continuous.    [provider]  lisinopril (ZESTRIL) 20 MG tablet Take 20 mg by mouth at bedtime.    [provider]  NOVOLOG 100 UNIT/ML injection Inject 0-100 Units into the skin See admin instructions. 0-100 units, PER INSULIN  PUMP, every 3 days    [provider]  ondansetron (ZOFRAN-ODT) 4 MG disintegrating tablet Take 4 mg by mouth every 8 (eight) hours as needed for nausea or vomiting (dissolve orally).    [provider]  oxyCODONE (OXY IR/ROXICODONE) 5 MG immediate release tablet Take 5 mg by mouth every 6 (six) hours as needed (for pain).    [provider]  triamcinolone cream (KENALOG) 0.5 % Apply 1 Application topically 2 (two) times daily as needed (to affected areas of the legs and nose).    [provider]      Allergies    Liraglutide    Review of Systems   Review of Systems  Skin:  Positive for wound.  All other systems reviewed and are negative.   Physical Exam Updated Vital Signs BP 135/65   Pulse 60   Temp 98.1 F (36.7 C)   Resp 16   Ht 5\' 7"  (1.702 m)   Wt 109.3 kg   SpO2 97%   BMI 37.75 kg/m   Physical Exam Vitals and nursing note reviewed.  Constitutional:      Appearance: He is well-developed.  HENT:     Head: Normocephalic and atraumatic.  Eyes:     Conjunctiva/sclera: Conjunctivae normal.     Pupils: Pupils are equal, round, and reactive to light.      Comments: Laceration left eyebrow/upper eyelid, slight oozing, no tenderness or deformity about the orbital rim  Neck:  Comments: No tenderness, full ROM without pain Cardiovascular:     Rate and Rhythm: Normal rate and regular rhythm.     Heart sounds: Normal heart sounds.  Pulmonary:     Effort: Pulmonary effort is normal.     Breath sounds: Normal breath sounds.  Abdominal:     General: Bowel sounds are normal.     Palpations: Abdomen is soft.  Musculoskeletal:        General: Normal range of motion.     Cervical back: Full passive range of motion without pain and normal range of motion.  Skin:    General: Skin is warm and dry.  Neurological:     Mental Status: He is alert and oriented to person, place, and time.     Comments: AAOx3, answering questions and following  commands appropriately; equal strength UE and LE bilaterally; CN grossly intact; moves all extremities appropriately without ataxia; no focal neuro deficits or facial asymmetry appreciated     ED Results / Procedures / Treatments   Labs (all labs ordered are listed, but only abnormal results are displayed) Labs Reviewed - No data to display  EKG None  Radiology No results found.  Procedures Procedures    LACERATION REPAIR Performed by: Garlon Hatchet Authorized by: Garlon Hatchet Consent: Verbal consent obtained. Risks and benefits: risks, benefits and alternatives were discussed Consent given by: patient Patient identity confirmed: provided demographic data Prepped and Draped in normal sterile fashion Wound explored  Laceration Location: left eyebrow  Laceration Length: 4cm  No Foreign Bodies seen or palpated  Anesthesia: local infiltration  Local anesthetic: lidocaine 1% without epinephrine  Anesthetic total: 5 ml  Irrigation method: syringe Amount of cleaning: standard  Skin closure: 4-0 Ethilon  Number of sutures: 5  Technique: simple interrupted  Patient tolerance: Patient tolerated the procedure well with no immediate complications.   Medications Ordered in ED Medications  lidocaine (PF) (XYLOCAINE) 1 % injection 5 mL (5 mLs Intradermal Given by Other 06/11/23 0419)  Tdap (BOOSTRIX) injection 0.5 mL (0.5 mLs Intramuscular Given 06/11/23 0417)    ED Course/ Medical Decision Making/ A&P                             Medical Decision Making Risk Prescription drug management.   72 year old male presenting to the ED with laceration to left eyebrow.  Rolled out of bed and struck face on the corner of nightstand.  There was no loss of consciousness.  He denies any other associated symptoms.  He is awake, alert, fully oriented here.  He has no focal neurologic deficits.  Does have laceration to left eyebrow/upper eyelid.  There is slight oozing but no  pulsatile bleeding.  No tenderness about the orbital rim and no gross deformity.  He takes daily aspirin but no formal anticoagulation.  Given his reassuring neurologic exam, do not feel he needs emergent head CT at this time.  Tetanus updated and laceration repaired as above, tolerated well.  Home wound care instructions given.  Will follow-up with PCP in 1 week for suture removal.  He can return here for any new or acute changes.  Final Clinical Impression(s) / ED Diagnoses Final diagnoses:  Laceration of left eyebrow, initial encounter    Rx / DC Orders ED Discharge Orders     None         Garlon Hatchet, PA-C 06/11/23 0443    Mardene Sayer, MD  06/11/23 1802  

## 2023-06-11 NOTE — Discharge Instructions (Signed)
Keep sutures clean and dry-- pat dry if they get wet. Can be removed in the next 7-10 days with primary care doctor. Return here for new concerns.

## 2023-06-11 NOTE — ED Triage Notes (Signed)
Patient reports he rolled out of bed this morning hitting face on nightstand, has laceration on left eyebrow. Takes aspirin daily.

## 2023-06-15 ENCOUNTER — Other Ambulatory Visit: Payer: Self-pay | Admitting: Urology

## 2023-06-15 DIAGNOSIS — N281 Cyst of kidney, acquired: Secondary | ICD-10-CM

## 2023-06-17 ENCOUNTER — Other Ambulatory Visit: Payer: Self-pay | Admitting: Urology

## 2023-06-21 ENCOUNTER — Encounter (HOSPITAL_BASED_OUTPATIENT_CLINIC_OR_DEPARTMENT_OTHER): Payer: Self-pay | Admitting: Urology

## 2023-06-21 ENCOUNTER — Other Ambulatory Visit: Payer: Self-pay

## 2023-06-21 NOTE — Progress Notes (Addendum)
Spoke w/ via phone for pre-op interview---pt Lab needs dos----  I stat             Lab results------nephrology lov dr Janit Pagan 02-28-2023 epic, EKG 05-18-2023 epic COVID test -----patient states asymptomatic no test needed Arrive at -------1230 07-11-2023 NPO after MN NO Solid Food.  Clear liquids from MN until---1130 Med rec completed Medications to take morning of surgery -----Amlodipine Diabetic medication -----run insulin pump @ basal rate, bring insulin pump sipplies day of surgery Patient instructed no nail polish to be worn day of surgery Patient instructed to bring photo id and insurance card day of surgery Patient aware to have Driver (ride ) / caregiver    for 24 hours after surgery  Patient Special Instructions -----none Pre-Op special Instructions -----none Patient verbalized understanding of instructions that were given at this phone interview. Patient denies shortness of breath, chest pain, fever, cough at this phone interview.

## 2023-07-10 ENCOUNTER — Encounter (HOSPITAL_BASED_OUTPATIENT_CLINIC_OR_DEPARTMENT_OTHER): Payer: Self-pay | Admitting: Urology

## 2023-07-10 NOTE — Anesthesia Preprocedure Evaluation (Addendum)
Anesthesia Evaluation  Patient identified by MRN, date of birth, ID band Patient awake    Reviewed: Allergy & Precautions, NPO status , Patient's Chart, lab work & pertinent test results, reviewed documented beta blocker date and time   Airway Mallampati: II  TM Distance: >3 FB Neck ROM: Full    Dental no notable dental hx. (+) Teeth Intact, Caps, Dental Advisory Given   Pulmonary sleep apnea and Continuous Positive Airway Pressure Ventilation    Pulmonary exam normal breath sounds clear to auscultation       Cardiovascular hypertension, Pt. on medications Normal cardiovascular exam Rhythm:Regular Rate:Normal  EKG 05/18/23 NSR, LAFB, low voltage precordial leads, possible anterior infarct- poor R wave progression precordial leads   Neuro/Psych negative neurological ROS  negative psych ROS   GI/Hepatic negative GI ROS, Neg liver ROS,,,  Endo/Other  diabetes, Well Controlled, Type 2, Insulin Dependent  HLD  Renal/GU Renal InsufficiencyRenal diseaseLeft ureteral calculus  negative genitourinary   Musculoskeletal negative musculoskeletal ROS (+)    Abdominal  (+) + obese  Peds  Hematology  (+) Blood dyscrasia, anemia   Anesthesia Other Findings   Reproductive/Obstetrics                              Anesthesia Physical Anesthesia Plan  ASA: 3  Anesthesia Plan: General   Post-op Pain Management: Dilaudid IV   Induction: Intravenous  PONV Risk Score and Plan: 4 or greater and Treatment may vary due to age or medical condition and Ondansetron  Airway Management Planned: LMA  Additional Equipment: None  Intra-op Plan:   Post-operative Plan: Extubation in OR  Informed Consent: I have reviewed the patients History and Physical, chart, labs and discussed the procedure including the risks, benefits and alternatives for the proposed anesthesia with the patient or authorized representative  who has indicated his/her understanding and acceptance.     Dental advisory given  Plan Discussed with: CRNA and Anesthesiologist  Anesthesia Plan Comments:          Anesthesia Quick Evaluation

## 2023-07-11 ENCOUNTER — Encounter (HOSPITAL_BASED_OUTPATIENT_CLINIC_OR_DEPARTMENT_OTHER): Payer: Self-pay | Admitting: Urology

## 2023-07-11 ENCOUNTER — Ambulatory Visit (HOSPITAL_BASED_OUTPATIENT_CLINIC_OR_DEPARTMENT_OTHER): Payer: Medicare Other | Admitting: Anesthesiology

## 2023-07-11 ENCOUNTER — Other Ambulatory Visit: Payer: Self-pay

## 2023-07-11 ENCOUNTER — Encounter (HOSPITAL_BASED_OUTPATIENT_CLINIC_OR_DEPARTMENT_OTHER)
Admission: RE | Disposition: A | Discharge status: Home / Self Care | Payer: Self-pay | Source: Home / Self Care | Attending: Urology

## 2023-07-11 ENCOUNTER — Ambulatory Visit (HOSPITAL_BASED_OUTPATIENT_CLINIC_OR_DEPARTMENT_OTHER)
Admission: RE | Admit: 2023-07-11 | Discharge: 2023-07-11 | Disposition: A | Discharge status: Home / Self Care | Payer: Medicare Other | Attending: Urology | Admitting: Urology

## 2023-07-11 DIAGNOSIS — G4730 Sleep apnea, unspecified: Secondary | ICD-10-CM | POA: Diagnosis not present

## 2023-07-11 DIAGNOSIS — I10 Essential (primary) hypertension: Secondary | ICD-10-CM | POA: Diagnosis not present

## 2023-07-11 DIAGNOSIS — E119 Type 2 diabetes mellitus without complications: Secondary | ICD-10-CM | POA: Insufficient documentation

## 2023-07-11 DIAGNOSIS — N201 Calculus of ureter: Secondary | ICD-10-CM | POA: Insufficient documentation

## 2023-07-11 DIAGNOSIS — Z79899 Other long term (current) drug therapy: Secondary | ICD-10-CM | POA: Diagnosis not present

## 2023-07-11 DIAGNOSIS — I129 Hypertensive chronic kidney disease with stage 1 through stage 4 chronic kidney disease, or unspecified chronic kidney disease: Secondary | ICD-10-CM | POA: Diagnosis not present

## 2023-07-11 DIAGNOSIS — N289 Disorder of kidney and ureter, unspecified: Secondary | ICD-10-CM | POA: Insufficient documentation

## 2023-07-11 DIAGNOSIS — N1832 Chronic kidney disease, stage 3b: Secondary | ICD-10-CM

## 2023-07-11 DIAGNOSIS — N135 Crossing vessel and stricture of ureter without hydronephrosis: Secondary | ICD-10-CM | POA: Diagnosis not present

## 2023-07-11 DIAGNOSIS — N281 Cyst of kidney, acquired: Secondary | ICD-10-CM | POA: Diagnosis not present

## 2023-07-11 DIAGNOSIS — Z01818 Encounter for other preprocedural examination: Secondary | ICD-10-CM

## 2023-07-11 HISTORY — DX: Personal history of urinary calculi: Z87.442

## 2023-07-11 HISTORY — PX: CYSTOSCOPY/URETEROSCOPY/HOLMIUM LASER/STENT PLACEMENT: SHX6546

## 2023-07-11 LAB — POCT I-STAT, CHEM 8
BUN: 32 mg/dL — ABNORMAL HIGH (ref 8–23)
Calcium, Ion: 1.29 mmol/L (ref 1.15–1.40)
Chloride: 106 mmol/L (ref 98–111)
Creatinine, Ser: 1.6 mg/dL — ABNORMAL HIGH (ref 0.61–1.24)
Glucose, Bld: 155 mg/dL — ABNORMAL HIGH (ref 70–99)
HCT: 38 % — ABNORMAL LOW (ref 39.0–52.0)
Hemoglobin: 12.9 g/dL — ABNORMAL LOW (ref 13.0–17.0)
Potassium: 5.1 mmol/L (ref 3.5–5.1)
Sodium: 140 mmol/L (ref 135–145)
TCO2: 25 mmol/L (ref 22–32)

## 2023-07-11 LAB — GLUCOSE, CAPILLARY: Glucose-Capillary: 150 mg/dL — ABNORMAL HIGH (ref 70–99)

## 2023-07-11 SURGERY — CYSTOSCOPY/URETEROSCOPY/HOLMIUM LASER/STENT PLACEMENT
Anesthesia: General | Laterality: Left

## 2023-07-11 MED ORDER — LIDOCAINE 2% (20 MG/ML) 5 ML SYRINGE
INTRAMUSCULAR | Status: DC | PRN
  Administered 2023-07-11: 60 mg via INTRAVENOUS

## 2023-07-11 MED ORDER — CEFAZOLIN SODIUM-DEXTROSE 2-4 GM/100ML-% IV SOLN
INTRAVENOUS | Status: AC
  Filled 2023-07-11: qty 100

## 2023-07-11 MED ORDER — MIDAZOLAM HCL 2 MG/2ML IJ SOLN
INTRAMUSCULAR | Status: AC
  Filled 2023-07-11: qty 2

## 2023-07-11 MED ORDER — OXYCODONE HCL 5 MG PO TABS: 5.0000 mg | ORAL_TABLET | Freq: Once | ORAL | Status: DC | PRN

## 2023-07-11 MED ORDER — FENTANYL CITRATE (PF) 100 MCG/2ML IJ SOLN
INTRAMUSCULAR | Status: AC
  Filled 2023-07-11: qty 2

## 2023-07-11 MED ORDER — ONDANSETRON HCL 4 MG/2ML IJ SOLN
INTRAMUSCULAR | Status: DC | PRN
Start: 2023-07-11 — End: 2023-07-11
  Administered 2023-07-11: 4 mg via INTRAVENOUS

## 2023-07-11 MED ORDER — EPHEDRINE SULFATE-NACL 50-0.9 MG/10ML-% IV SOSY
PREFILLED_SYRINGE | INTRAVENOUS | Status: DC | PRN
  Administered 2023-07-11 (×2): 5 mg via INTRAVENOUS

## 2023-07-11 MED ORDER — PROPOFOL 10 MG/ML IV BOLUS
INTRAVENOUS | Status: DC | PRN
  Administered 2023-07-11: 180 mg via INTRAVENOUS

## 2023-07-11 MED ORDER — PROPOFOL 10 MG/ML IV BOLUS
INTRAVENOUS | Status: AC
  Filled 2023-07-11: qty 20

## 2023-07-11 MED ORDER — SODIUM CHLORIDE 0.9 % IV SOLN: INTRAVENOUS | Status: DC

## 2023-07-11 MED ORDER — AMISULPRIDE (ANTIEMETIC) 5 MG/2ML IV SOLN: 10.0000 mg | Freq: Once | INTRAVENOUS | Status: DC | PRN

## 2023-07-11 MED ORDER — IOHEXOL 300 MG/ML  SOLN
INTRAMUSCULAR | Status: DC | PRN
  Administered 2023-07-11: 5 mL via URETHRAL

## 2023-07-11 MED ORDER — MIDAZOLAM HCL 5 MG/5ML IJ SOLN
INTRAMUSCULAR | Status: DC | PRN
  Administered 2023-07-11: 2 mg via INTRAVENOUS

## 2023-07-11 MED ORDER — HYDROMORPHONE HCL 1 MG/ML IJ SOLN: 0.2500 mg | INTRAMUSCULAR | Status: DC | PRN

## 2023-07-11 MED ORDER — OXYCODONE HCL 5 MG/5ML PO SOLN: 5.0000 mg | Freq: Once | ORAL | Status: DC | PRN

## 2023-07-11 MED ORDER — LIDOCAINE HCL (PF) 2 % IJ SOLN
INTRAMUSCULAR | Status: AC
  Filled 2023-07-11: qty 5

## 2023-07-11 MED ORDER — OXYCODONE-ACETAMINOPHEN 5-325 MG PO TABS: 1.0000 | ORAL_TABLET | ORAL | 0 refills | Status: DC | PRN

## 2023-07-11 MED ORDER — ONDANSETRON HCL 4 MG/2ML IJ SOLN
INTRAMUSCULAR | Status: AC
  Filled 2023-07-11: qty 2

## 2023-07-11 MED ORDER — ONDANSETRON HCL 4 MG/2ML IJ SOLN: 4.0000 mg | Freq: Once | INTRAMUSCULAR | Status: DC | PRN

## 2023-07-11 MED ORDER — PHENYLEPHRINE 80 MCG/ML (10ML) SYRINGE FOR IV PUSH (FOR BLOOD PRESSURE SUPPORT)
PREFILLED_SYRINGE | INTRAVENOUS | Status: DC | PRN
  Administered 2023-07-11: 80 ug via INTRAVENOUS

## 2023-07-11 MED ORDER — DEXAMETHASONE SODIUM PHOSPHATE 10 MG/ML IJ SOLN
INTRAMUSCULAR | Status: AC
  Filled 2023-07-11: qty 1

## 2023-07-11 MED ORDER — DOCUSATE SODIUM 100 MG PO CAPS: 100.0000 mg | ORAL_CAPSULE | Freq: Every day | ORAL | 0 refills | Status: DC | PRN

## 2023-07-11 MED ORDER — CEFAZOLIN SODIUM-DEXTROSE 2-4 GM/100ML-% IV SOLN
2.0000 g | INTRAVENOUS | Status: AC
  Administered 2023-07-11: 2 g via INTRAVENOUS

## 2023-07-11 MED ORDER — FENTANYL CITRATE (PF) 100 MCG/2ML IJ SOLN
INTRAMUSCULAR | Status: DC | PRN
  Administered 2023-07-11: 50 ug via INTRAVENOUS

## 2023-07-11 MED ORDER — SODIUM CHLORIDE 0.9 % IR SOLN
Status: DC | PRN
  Administered 2023-07-11: 3000 mL via INTRAVESICAL

## 2023-07-11 SURGICAL SUPPLY — 26 items
BAG DRAIN URO-CYSTO SKYTR STRL (DRAIN) ×1 IMPLANT
BAG DRN UROCATH (DRAIN) ×1
BASKET ZERO TIP NITINOL 2.4FR (BASKET) IMPLANT
BSKT STON RTRVL ZERO TP 2.4FR (BASKET) ×1
CATH SET URETHRAL DILATOR (CATHETERS) IMPLANT
CATH URETL OPEN 5X70 (CATHETERS) ×1 IMPLANT
CLOTH BEACON ORANGE TIMEOUT ST (SAFETY) ×1 IMPLANT
GLOVE BIO SURGEON STRL SZ7 (GLOVE) ×1 IMPLANT
GOWN STRL REUS W/TWL LRG LVL3 (GOWN DISPOSABLE) ×1 IMPLANT
GUIDEWIRE STR DUAL SENSOR (WIRE) ×1 IMPLANT
GUIDEWIRE ZIPWRE .038 STRAIGHT (WIRE) IMPLANT
IV NS 1000ML (IV SOLUTION) ×1
IV NS 1000ML BAXH (IV SOLUTION) ×1 IMPLANT
IV NS IRRIG 3000ML ARTHROMATIC (IV SOLUTION) ×1 IMPLANT
KIT TURNOVER CYSTO (KITS) ×1 IMPLANT
LASER FIB FLEXIVA PULSE ID 365 (Laser) IMPLANT
MANIFOLD NEPTUNE II (INSTRUMENTS) ×1 IMPLANT
NS IRRIG 500ML POUR BTL (IV SOLUTION) ×1 IMPLANT
PACK CYSTO (CUSTOM PROCEDURE TRAY) ×1 IMPLANT
SLEEVE SCD COMPRESS KNEE MED (STOCKING) ×1 IMPLANT
STENT URET 6FRX26 CONTOUR (STENTS) IMPLANT
SYR 10ML LL (SYRINGE) ×1 IMPLANT
TRACTIP FLEXIVA PULS ID 200XHI (Laser) IMPLANT
TRACTIP FLEXIVA PULSE ID 200 (Laser)
TUBE CONNECTING 12X1/4 (SUCTIONS) ×1 IMPLANT
TUBING UROLOGY SET (TUBING) ×1 IMPLANT

## 2023-07-11 NOTE — Discharge Instructions (Addendum)
[facility] [phone] Post Ureteroscopy With or Without Stent Instructions  Definitions:  Ureter: The duct that transports urine from the kidney to the bladder. Stent:   A plastic hollow tube that is placed into the ureter, from the kidney to the bladder to prevent the ureter from swelling shut.  GENERAL INSTRUCTIONS:  Despite the fact that no skin incisions were used, the area around the ureter and bladder is raw and irritated. The stent is a foreign body which will further irritate the bladder wall. This irritation is manifested by increased frequency of urination, both day and night, and by an increase in the urge to urinate. In some, the urge to urinate is present almost always. Sometimes the urge is strong enough that you may not be able to stop yourself from urinating. The only real cure is to remove the stent and then give time for the bladder wall to heal which can't be done until the danger of the ureter swelling shut has passed, which varies.  You may see some blood in your urine while the stent is in place and a few days afterwards. Do not be alarmed, even if the urine was clear for a while. Get off your feet and drink lots of fluids until clearing occurs. If you start to pass clots or don't improve, call us.  DIET: You may return to your normal diet immediately. Because of the raw surface of your bladder, alcohol, spicy foods, acid type foods and drinks with caffeine may cause irritation or frequency and should be used in moderation. To keep your urine flowing freely and to avoid constipation, drink plenty of fluids during the day ( 8-10 glasses ). Tip: Avoid cranberry juice because it is very acidic.  ACTIVITY: Your physical activity doesn't need to be restricted. However, if you are very active, you may see some blood in your urine. We suggest that you reduce your activity under these circumstances until the bleeding has stopped.  BOWELS: It is important to  keep your bowels regular during the postoperative period. Straining with bowel movements can cause bleeding. A bowel movement every other day is reasonable. Use a mild laxative if needed, such as Milk of Magnesia 2-3 tablespoons, or 2 Dulcolax tablets. Call if you continue to have problems. If you have been taking narcotics for pain, before, during or after your surgery, you may be constipated. Take a laxative if necessary.   MEDICATION: You should resume your pre-surgery medications unless told not to. In addition you will often be given an antibiotic to prevent infection. These should be taken as prescribed until the bottles are finished unless you are having an unusual reaction to one of the drugs.  PROBLEMS YOU SHOULD REPORT TO [place]: Fevers over 100.5 Fahrenheit. Heavy bleeding, or clots ( See above notes about blood in urine ). Inability to urinate. Drug reactions ( hives, rash, nausea, vomiting, diarrhea ). Severe burning or pain with urination that is not improving.  FOLLOW-UP: You will need a follow-up appointment to monitor your progress. Call for this appointment at the number listed above. Usually the first appointment will be about three to fourteen days after your surgery.  You have a stent draining your kidney.  This will be removed in 3 days by pulling on the attached string.

## 2023-07-11 NOTE — Anesthesia Procedure Notes (Signed)
Procedure Name: LMA Insertion Date/Time: 07/11/2023 1:22 PM  Performed by: Earmon Phoenix, CRNAPre-anesthesia Checklist: Patient identified, Emergency Drugs available, Suction available, Patient being monitored and Timeout performed Patient Re-evaluated:Patient Re-evaluated prior to induction Oxygen Delivery Method: Circle system utilized Preoxygenation: Pre-oxygenation with 100% oxygen Induction Type: IV induction Ventilation: Mask ventilation without difficulty LMA: LMA inserted LMA Size: 4.0 Number of attempts: 1 Placement Confirmation: positive ETCO2 and breath sounds checked- equal and bilateral Tube secured with: Tape Dental Injury: Teeth and Oropharynx as per pre-operative assessment

## 2023-07-11 NOTE — Anesthesia Postprocedure Evaluation (Signed)
Anesthesia Post Note  Patient: SATYA BUTTRAM  Procedure(s) Performed: CYSTOSCOPY/LEFT RETROGRADE PYELOGRAM/LEFT URETEROSCOPY/LEFT STENT EXCHANGE STONE BASKET EXTRACTION (Left)     Patient location during evaluation: PACU Anesthesia Type: General Level of consciousness: awake and alert and oriented Pain management: pain level controlled Vital Signs Assessment: post-procedure vital signs reviewed and stable Respiratory status: spontaneous breathing, nonlabored ventilation and respiratory function stable Cardiovascular status: blood pressure returned to baseline and stable Postop Assessment: no apparent nausea or vomiting Anesthetic complications: no   No notable events documented.  Last Vitals:  Vitals:   07/11/23 1147 07/11/23 1400  BP: (!) 140/68 (!) 104/50  Pulse: 66   Resp: 18 16  Temp: (!) 36.3 C 36.6 C  SpO2: 99%     Last Pain:  Vitals:   07/11/23 1415  TempSrc:   PainSc: 0-No pain                 Verbon Giangregorio,Jamaris A.

## 2023-07-11 NOTE — Progress Notes (Signed)
Alert and oriented, gait steady, VSS, denies any pain or discomfort.

## 2023-07-11 NOTE — Transfer of Care (Signed)
Immediate Anesthesia Transfer of Care Note  Patient: Jorge Alexander  Procedure(s) Performed: CYSTOSCOPY/LEFT RETROGRADE PYELOGRAM/LEFT URETEROSCOPY/LEFT STENT EXCHANGE STONE BASKET EXTRACTION (Left)  Patient Location: PACU  Anesthesia Type:General  Level of Consciousness: drowsy  Airway & Oxygen Therapy: Patient Spontanous Breathing and Patient connected to nasal cannula oxygen  Post-op Assessment: Report given to RN and Post -op Vital signs reviewed and stable  Post vital signs: Reviewed and stable  Last Vitals:  Vitals Value Taken Time  BP    Temp    Pulse 71 07/11/23 1400  Resp    SpO2 98 % 07/11/23 1400  Vitals shown include unfiled device data.  Last Pain:  Vitals:   07/11/23 1147  TempSrc: Oral  PainSc: 0-No pain      Patients Stated Pain Goal: 8 (07/11/23 1147)  Complications: No notable events documented.

## 2023-07-11 NOTE — Op Note (Signed)
Operative Note  Preoperative diagnosis:  1.  Left ureteral stones  Postoperative diagnosis: 1.  Left ureteral stones  Procedure(s): 1.  Cystoscopy 2. Left ureteroscopy with basket extraction of stones. 3. Left retrograde pyelogram 4. Left ureteral stent exchange 5. Fluoroscopy with intraoperative interpretation  Surgeon: Jettie Pagan, MD  Assistants:  None  Anesthesia:  General  Complications:  None  EBL:  Minimal  Specimens: 1. Stones for stone analysis (to be done at [facility])  Drains/Catheters: 1.  Left 6Fr x 26cm ureteral stent with a tether string  Intraoperative findings:   Cystoscopy demonstrated obstructing prostate with large median/intravesical lobes. No suspicious bladder lesions. Left ureteroscopy demonstrated 3 left ureteral stones, mid left ureteral stricture. Left successful stent exchange.  Indication:  Jorge Alexander is a 72 y.o. male with a history of left ureteral stones here for left ureteroscopy with stone removal.  Description of procedure: After informed consent was obtained from the patient, the patient was identified and taken to the operating room and placed in the supine position.  General anesthesia was administered as well as perioperative IV antibiotics.  At the beginning of the case, a time-out was performed to properly identify the patient, the surgery to be performed, and the surgical site.  Sequential compression devices were applied to the lower extremities at the beginning of the case for DVT prophylaxis.  The patient was then placed in the dorsal lithotomy supine position, prepped and draped in sterile fashion.  We then passed the 21-French rigid cystoscope through the urethra and into the bladder under vision without any difficulty, noting a normal urethra without strictures and an obstructing prostate with large intravesical/median lobes.  A systematic evaluation of the bladder revealed no evidence of any suspicious bladder  lesions.  Ureteral orifices were in normal position.    The distal aspect of the ureteral stent was seen protruding from the left ureteral orifice.  We then used the alligator-tooth forceps and grasped the distal end of the ureteral stent and brought it out the urethral meatus while watching the proximal coil straighten out nicely on fluoroscopy. Through the ureteral stent, we then passed a 0.038 sensor wire up to the level of the renal pelvis.  The ureteral stent was then removed, leaving the sensor wire up the left ureter.    A semi-rigid ureteroscope was passed alongside the wire up the distal ureter which appeared normal.  He had a stricture in his mid left ureter likely corresponding to prior stones and subsequent treatment.  Using a 0 tip basket, each of the 3 stones were individually grasped with a grasper and basket extracted atraumatically.  A second 0.038 sensor wire was passed under direct vision and the semirigid scope was removed. The flexible ureteroscope was advanced into the collecting system. The collecting system was inspected. There were no stone seen within the kidney.  With the ureteroscope in the kidney, a gentle pyelogram was performed to delineate the calyceal system and we evaluated the calyces systematically. We encountered no stones.   We then withdrew the ureteroscope back down the ureter, noting no evidence of any stones along the course of the ureter.  Prior to removing the ureteroscope, we did pass the Glidewire back up to the ureter to the renal pelvis.  Once the ureteroscope was removed, the Glidewire was backloaded through the rigid cystoscope, which was then advanced down the urethra and into the bladder. We then used the Glidewire under direct vision through the rigid cystoscope and under fluoroscopic  guidance and passed up a 6-French, 26 cm double-pigtail ureteral stent up ureter, making sure that the proximal and distal ends coiled within the kidney and bladder  respectively.  Note that we left a long tether string attached to the distal end of the ureteral stent and it exited the urethral meatus and was secured to the penile shaft with a tegaderm adhesive.  The cystoscope was then advanced back into the bladder under vision.  We were able to see the distal stent coiling nicely within the bladder.  The bladder was then emptied with irrigation solution.  The cystoscope was then removed.    The patient tolerated the procedure well and there was no complication. Patient was awoken from anesthesia and taken to the recovery room in stable condition. I was present and scrubbed for the entirety of the case.  Plan:  Patient will be discharged home.  He will remove his stent in 3 days.  Matt R. Murel Shenberger MD [facility]  Pager: [phone]

## 2023-07-11 NOTE — H&P (Signed)
Office Visit Report     06/14/2023   --------------------------------------------------------------------------------   Jorge Alexander  MRN: [id number]  DOB: 1951-09-28, 72 year old Male  SSN:    PRIMARY CARE:     REFERRING:    PROVIDER:  Jettie Pagan, M.D.  LOCATION:  [facility] - [phone]     --------------------------------------------------------------------------------   CC/HPI: Jorge Alexander is a 72 year old male seen in consultation today for a left ureteral stone as well as an indeterminate 3.1 cm right renal lesion.   1. Urolithiasis: He presented to the ED on 05/18/2023 with left-sided flank pain. He was found to have 2 left ureteral stones including a 2 mm and 4 mm distal left ureteral stone. He also had nonobstructing bilateral stones measuring up to 11 mm in the right and punctate on the left. He underwent left ureteral stent placement on 05/18/2023 with Dr. Sherron Monday.  -He has had a history of urolithiasis and underwent ureteroscopy in the past in [facility].   #2. Indeterminate right renal mass: CT A/P 05/17/2023 revealed indeterminate 3.1 cm heterogenous right renal lesion. Radiologist recommended MRI for further evaluation. He denies abdominal pain or flank pain. He denies family history of urologic malignancy.   He has a past medical history of diabetes.     ALLERGIES: No Known Drug Allergies    MEDICATIONS: Lisinopril 10 mg tablet     GU PSH: No GU PSH    NON-GU PSH: No Non-GU PSH    GU PMH: No GU PMH    NON-GU PMH: No Non-GU PMH    FAMILY HISTORY: No Family History    SOCIAL HISTORY: No Social History    REVIEW OF SYSTEMS:    GU Review Male:   Patient reports get up at night to urinate. Patient denies frequent urination, hard to postpone urination, burning/ pain with urination, leakage of urine, stream starts and stops, trouble starting your stream, have to strain to urinate , erection problems, and penile pain.  Gastrointestinal  (Upper):   Patient denies nausea, vomiting, and indigestion/ heartburn.  Gastrointestinal (Lower):   Patient denies diarrhea and constipation.  Constitutional:   Patient denies fever, night sweats, weight loss, and fatigue.  Skin:   Patient denies skin rash/ lesion and itching.  Eyes:   Patient denies blurred vision and double vision.  Ears/ Nose/ Throat:   Patient denies sore throat and sinus problems.  Hematologic/Lymphatic:   Patient denies swollen glands and easy bruising.  Cardiovascular:   Patient denies leg swelling and chest pains.  Respiratory:   Patient denies cough and shortness of breath.  Endocrine:   Patient denies excessive thirst.  Musculoskeletal:   Patient denies joint pain and back pain.  Neurological:   Patient denies headaches and dizziness.  Psychologic:   Patient denies depression and anxiety.   VITAL SIGNS:      06/14/2023 03:27 PM  BP 124/76 mmHg  Pulse 73 /min  Temperature 97.1 F / 36.1 C   MULTI-SYSTEM PHYSICAL EXAMINATION:    Constitutional: Well-nourished. No physical deformities. Normally developed. Good grooming.  Respiratory: No labored breathing, no use of accessory muscles.   Cardiovascular: Normal temperature, normal extremity pulses, no swelling, no varicosities.  Gastrointestinal: No mass, no tenderness, no rigidity, non obese abdomen.     Complexity of Data:  Source Of History:  Patient, Medical Record Summary  Records Review:   Previous Doctor Records, Previous Patient Records  Urine Test Review:   Urinalysis   PROCEDURES:  Visit Complexity - G2211          Urinalysis w/Scope Dipstick Dipstick Cont'd Micro  Color: Yellow Bilirubin: Neg mg/dL WBC/hpf: 0 - 5/hpf  Appearance: Cloudy Ketones: Neg mg/dL RBC/hpf: 20 - 16/XWR  Specific Gravity: 1.025 Blood: 3+ ery/uL Bacteria: Few (10-25/hpf)  pH: <=5.0 Protein: 1+ mg/dL Cystals: NS (Not Seen)  Glucose: Neg mg/dL Urobilinogen: 0.2 mg/dL Casts: NS (Not Seen)    Nitrites: Neg  Trichomonas: Not Present    Leukocyte Esterase: 1+ leu/uL Mucous: Present      Epithelial Cells: 0 - 5/hpf      Yeast: NS (Not Seen)      Sperm: Not Present    ASSESSMENT:      ICD-10 Details  1 GU:   Ureteral calculus - N20.1   2   Renal cyst - N28.1    PLAN:           Orders Labs Urine Culture  X-Rays: MRI Kidney With and Without I.V. Contrast          Schedule         Document Letter(s):  Created for Patient: Clinical Summary         Notes:     #1. Left ureteral stone: S/p left stent 05/18/2023. Stones measure 2 mm and 4 mm in the distal left ureter. I recommend cystoscopy, left ureteroscopy with laser lithotripsy and basket traction of stones. Discussed risk of benefits. Surgery letter sent. Will send urine for culture.   We discussed the options for management of kidney stones, including observation, ESWL, ureteroscopy with laser lithotripsy, and PCNL. The risks and benefits of each option were discussed.  For observation I described the risks which include but are not limited to silent renal damage, life-threatening infection, need for emergent surgery, failure to pass stone, and pain.   ESWL: risks and benefits of ESWL were outlined including infection, bleeding, pain, steinstrasse, kidney injury, need for ancillary treatments, and global anesthesia risks including but not limited to CVA, MI, DVT, PE, pneumonia, and death.   Ureteroscopy: risks and benefits of ureteroscopy were outlined, including infection, bleeding, pain, temporary ureteral stent and associated stent bother, ureteral injury, ureteral stricture, need for ancillary treatments, and global anesthesia risks including but not limited to CVA, MI, DVT, PE, pneumonia, and death.   #2. Indeterminate right renal mass: I reviewed CT imaging 04/2023 with indeterminate 3 cm lesion. I recommend MRI for further evaluation. Will notify result.   Urology Preoperative H&P   Chief Complaint: Left ureteral  stones  History of Present Illness: Jorge Alexander is a 72 y.o. male with left ureteral stones here for cysto, L URS/LL, L RPG, L stent exchange. Denies fevers, chills, dysuria. Ucx 6/18 NG.    Past Medical History:  Diagnosis Date   Diabetes mellitus without complication (HCC) type 2    History of kidney stones    Hypertension    Sleep apnea    uses cpap seton 10 some nights    Past Surgical History:  Procedure Laterality Date   CYSTOSCOPY W/ STONE MANIPULATION Right 2011   CYSTOSCOPY WITH RETROGRADE PYELOGRAM, URETEROSCOPY AND STENT PLACEMENT Left 05/18/2023   Procedure: CYSTOSCOPY WITH RETROGRADE PYELOGRAM,  AND STENT PLACEMENT;  Surgeon: Alfredo Martinez, MD;  Location: [facility];  Service: Urology;  Laterality: Left;   LITHOTRIPSY Right 2015    Allergies:  Allergies  Allergen Reactions   Liraglutide Nausea Only and Other (See Comments)    GI Intolerance (Victoza or Saxenda)  History reviewed. No pertinent family history.  Social History:  reports that he has never smoked. He has never used smokeless tobacco. He reports that he does not currently use alcohol. He reports that he does not currently use drugs.  ROS: A complete review of systems was performed.  All systems are negative except for pertinent findings as noted.  Physical Exam:  Vital signs in last 24 hours: Temp:  [97.4 F (36.3 C)] 97.4 F (36.3 C) (07/15 1147) Pulse Rate:  [66] 66 (07/15 1147) Resp:  [18] 18 (07/15 1147) BP: (140)/(68) 140/68 (07/15 1147) SpO2:  [99 %] 99 % (07/15 1147) Weight:  [112.8 kg] 112.8 kg (07/15 1147) Constitutional:  Alert and oriented, No acute distress Cardiovascular: Regular rate and rhythm Respiratory: Normal respiratory effort, Lungs clear bilaterally GI: Abdomen is soft, nontender, nondistended, no abdominal masses GU: No CVA tenderness Lymphatic: No lymphadenopathy Neurologic: Grossly intact, no focal deficits Psychiatric: Normal mood and affect  Laboratory  Data:  Recent Labs    07/11/23 1207  HGB 12.9*  HCT 38.0*    Recent Labs    07/11/23 1207  NA 140  K 5.1  CL 106  GLUCOSE 155*  BUN 32*  CREATININE 1.60*     Results for orders placed or performed during the hospital encounter of 07/11/23 (from the past 24 hour(s))  I-STAT, chem 8     Status: Abnormal   Collection Time: 07/11/23 12:07 PM  Result Value Ref Range   Sodium 140 135 - 145 mmol/L   Potassium 5.1 3.5 - 5.1 mmol/L   Chloride 106 98 - 111 mmol/L   BUN 32 (H) 8 - 23 mg/dL   Creatinine, Ser 9.62 (H) 0.61 - 1.24 mg/dL   Glucose, Bld 952 (H) 70 - 99 mg/dL   Calcium, Ion 8.41 3.24 - 1.40 mmol/L   TCO2 25 22 - 32 mmol/L   Hemoglobin 12.9 (L) 13.0 - 17.0 g/dL   HCT 40.1 (L) 02.7 - 25.3 %   No results found for this or any previous visit (from the past 240 hour(s)).  Renal Function: Recent Labs    07/11/23 1207  CREATININE 1.60*   Estimated Creatinine Clearance: 51.5 mL/min (A) (by C-G formula based on SCr of 1.6 mg/dL (H)).  Radiologic Imaging: No results found.  I independently reviewed the above imaging studies.  Assessment and Plan EXAVIER LINA is a 72 y.o. male with left ureteral stones here for cysto, L URS/LL, L RPG, L stent exchange.   -The risks, benefits and alternatives of cystoscopy with cysto, L URS/LL, L RPG, L stent exchange was discussed with the patient.  Risks include, but are not limited to: bleeding, urinary tract infection, ureteral injury, ureteral stricture disease, chronic pain, urinary symptoms, bladder injury, stent migration, the need for nephrostomy tube placement, MI, CVA, DVT, PE and the inherent risks with general anesthesia.  The patient voices understanding and wishes to proceed.   Matt R. Mykela Mewborn MD 07/11/2023, 12:32 PM  [facility] Pager: [phone]): [phone]

## 2023-07-12 ENCOUNTER — Encounter (HOSPITAL_BASED_OUTPATIENT_CLINIC_OR_DEPARTMENT_OTHER): Payer: Self-pay | Admitting: Urology

## 2023-08-04 ENCOUNTER — Ambulatory Visit
Admission: RE | Admit: 2023-08-04 | Discharge: 2023-08-04 | Disposition: A | Discharge status: Home / Self Care | Payer: Medicare Other | Source: Ambulatory Visit | Attending: Urology | Admitting: Urology

## 2023-08-04 DIAGNOSIS — N281 Cyst of kidney, acquired: Secondary | ICD-10-CM

## 2023-08-04 MED ORDER — GADOPICLENOL 0.5 MMOL/ML IV SOLN
10.0000 mL | Freq: Once | INTRAVENOUS | Status: AC | PRN
  Administered 2023-08-04: 10 mL via INTRAVENOUS

## 2023-08-09 ENCOUNTER — Other Ambulatory Visit (HOSPITAL_COMMUNITY): Payer: Self-pay | Admitting: Urology

## 2023-08-09 ENCOUNTER — Ambulatory Visit (HOSPITAL_COMMUNITY)
Admission: RE | Admit: 2023-08-09 | Discharge: 2023-08-09 | Disposition: A | Discharge status: Home / Self Care | Payer: Medicare Other | Source: Ambulatory Visit | Attending: Urology | Admitting: Urology

## 2023-08-09 DIAGNOSIS — D49511 Neoplasm of unspecified behavior of right kidney: Secondary | ICD-10-CM | POA: Diagnosis present

## 2023-08-09 DIAGNOSIS — I82220 Acute embolism and thrombosis of inferior vena cava: Secondary | ICD-10-CM | POA: Diagnosis present

## 2023-08-16 ENCOUNTER — Other Ambulatory Visit: Payer: Self-pay | Admitting: Urology

## 2023-09-28 NOTE — Progress Notes (Addendum)
Anesthesia Review:  PCP: Cardiologist : Chest x-ray : 08/13/23- 2 view  EKG : 05/18/23  Echo : Stress test: Cardiac Cath :  Activity level:  Sleep Study/ CPAP : Fasting Blood Sugar :      / Checks Blood Sugar -- times a day:   Blood Thinner/ Instructions /Last Dose: ASA / Instructions/ Last Dose :    DM- type Dexcom Monitor   Insulin Pump  Hgba1c-    07/11/23- cystoscopy

## 2023-09-28 NOTE — Patient Instructions (Signed)
SURGICAL WAITING ROOM VISITATION  Patients having surgery or a procedure may have no more than 2 support people in the waiting area - these visitors may rotate.    Children under the age of 72 must have an adult with them who is not the patient.  Due to an increase in RSV and influenza rates and associated hospitalizations, children ages 56 and under may not visit patients in [facility] hospitals.  If the patient needs to stay at the hospital during part of their recovery, the visitor guidelines for inpatient rooms apply. Pre-op nurse will coordinate an appropriate time for 1 support person to accompany patient in pre-op.  This support person may not rotate.    Please refer to the [facility] website for the visitor guidelines for Inpatients (after your surgery is over and you are in a regular room).       Your procedure is scheduled on:  10/07/2023    Report to [facility] Main Entrance    Report to admitting at  0515 AM   Call this number if you have problems the morning of surgery [phone]             Clear liquid diet the day before surgery.               Magnesium citrate 8 ounces at 12 noon day before surgery             Fleets enema nite before surgery.                 If you have questions, please contact your surgeon's office.   FOLLOW BOWEL PREP AND ANY ADDITIONAL PRE OP INSTRUCTIONS YOU RECEIVED FROM YOUR SURGEON'S OFFICE!!!     Oral Hygiene is also important to reduce your risk of infection.                                    Remember - BRUSH YOUR TEETH THE MORNING OF SURGERY WITH YOUR REGULAR TOOTHPASTE  DENTURES WILL BE REMOVED PRIOR TO SURGERY PLEASE DO NOT APPLY "Poly grip" OR ADHESIVES!!!   Do NOT smoke after Midnight   Stop all vitamins and herbal supplements 7 days before surgery.   Take these medicines the morning of surgery with A SIP OF WATER:  amlodipine   DO NOT TAKE ANY ORAL DIABETIC MEDICATIONS DAY OF YOUR SURGERY  Bring CPAP  mask and tubing day of surgery.                              You may not have any metal on your body including hair pins, jewelry, and body piercing             Do not wear make-up, lotions, powders, perfumes/cologne, or deodorant  Do not wear nail polish including gel and S&S, artificial/acrylic nails, or any other type of covering on natural nails including finger and toenails. If you have artificial nails, gel coating, etc. that needs to be removed by a nail salon please have this removed prior to surgery or surgery may need to be canceled/ delayed if the surgeon/ anesthesia feels like they are unable to be safely monitored.   Do not shave  48 hours prior to surgery.               Men may shave face and neck.  Do not bring valuables to the hospital. [place] IS NOT             RESPONSIBLE   FOR VALUABLES.   Contacts, glasses, dentures or bridgework may not be worn into surgery.   Bring small overnight bag day of surgery.   DO NOT BRING YOUR HOME MEDICATIONS TO THE HOSPITAL. PHARMACY WILL DISPENSE MEDICATIONS LISTED ON YOUR MEDICATION LIST TO YOU DURING YOUR ADMISSION IN THE HOSPITAL!    Patients discharged on the day of surgery will not be allowed to drive home.  Someone NEEDS to stay with you for the first 24 hours after anesthesia.   Special Instructions: Bring a copy of your healthcare power of attorney and living will documents the day of surgery if you haven't scanned them before.              Please read over the following fact sheets you were given: IF YOU HAVE QUESTIONS ABOUT YOUR PRE-OP INSTRUCTIONS PLEASE CALL [phone]   If you received a COVID test during your pre-op visit  it is requested that you wear a mask when out in public, stay away from anyone that may not be feeling well and notify your surgeon if you develop symptoms. If you test positive for Covid or have been in contact with anyone that has tested positive in the last 10 days please notify you  surgeon.    [place] - Preparing for Surgery Before surgery, you can play an important role.  Because skin is not sterile, your skin needs to be as free of germs as possible.  You can reduce the number of germs on your skin by washing with CHG (chlorahexidine gluconate) soap before surgery.  CHG is an antiseptic cleaner which kills germs and bonds with the skin to continue killing germs even after washing. Please DO NOT use if you have an allergy to CHG or antibacterial soaps.  If your skin becomes reddened/irritated stop using the CHG and inform your nurse when you arrive at Short Stay. Do not shave (including legs and underarms) for at least 48 hours prior to the first CHG shower.  You may shave your face/neck. Please follow these instructions carefully:  1.  Shower with CHG Soap the night before surgery and the  morning of Surgery.  2.  If you choose to wash your hair, wash your hair first as usual with your  normal  shampoo.  3.  After you shampoo, rinse your hair and body thoroughly to remove the  shampoo.                           4.  Use CHG as you would any other liquid soap.  You can apply chg directly  to the skin and wash                       Gently with a scrungie or clean washcloth.  5.  Apply the CHG Soap to your body ONLY FROM THE NECK DOWN.   Do not use on face/ open                           Wound or open sores. Avoid contact with eyes, ears mouth and genitals (private parts).                       Wash face,  Genitals (private parts) with your normal soap.             6.  Wash thoroughly, paying special attention to the area where your surgery  will be performed.  7.  Thoroughly rinse your body with warm water from the neck down.  8.  DO NOT shower/wash with your normal soap after using and rinsing off  the CHG Soap.                9.  Pat yourself dry with a clean towel.            10.  Wear clean pajamas.            11.  Place clean sheets on your bed the night of your  first shower and do not  sleep with pets. Day of Surgery : Do not apply any lotions/deodorants the morning of surgery.  Please wear clean clothes to the hospital/surgery center.  FAILURE TO FOLLOW THESE INSTRUCTIONS MAY RESULT IN THE CANCELLATION OF YOUR SURGERY PATIENT SIGNATURE_________________________________  NURSE SIGNATURE__________________________________  ________________________________________________________________________

## 2023-09-29 ENCOUNTER — Other Ambulatory Visit: Payer: Self-pay

## 2023-09-29 ENCOUNTER — Encounter (HOSPITAL_COMMUNITY): Payer: Self-pay

## 2023-09-29 ENCOUNTER — Encounter (HOSPITAL_COMMUNITY)
Admission: RE | Admit: 2023-09-29 | Discharge: 2023-09-29 | Disposition: A | Discharge status: Home / Self Care | Payer: Medicare Other | Source: Ambulatory Visit | Attending: Urology | Admitting: Urology

## 2023-09-29 VITALS — BP 117/67 | HR 65 | Temp 97.9°F | Resp 16 | Ht 67.0 in | Wt 247.0 lb

## 2023-09-29 DIAGNOSIS — Z01812 Encounter for preprocedural laboratory examination: Secondary | ICD-10-CM | POA: Diagnosis not present

## 2023-09-29 DIAGNOSIS — Z01818 Encounter for other preprocedural examination: Secondary | ICD-10-CM

## 2023-09-29 HISTORY — DX: Malignant (primary) neoplasm, unspecified: C80.1

## 2023-09-29 HISTORY — DX: Nausea with vomiting, unspecified: Z98.890

## 2023-09-29 HISTORY — DX: Anemia, unspecified: D64.9

## 2023-09-29 LAB — BASIC METABOLIC PANEL
Anion gap: 8 (ref 5–15)
BUN: 39 mg/dL — ABNORMAL HIGH (ref 8–23)
CO2: 25 mmol/L (ref 22–32)
Calcium: 8.9 mg/dL (ref 8.9–10.3)
Chloride: 106 mmol/L (ref 98–111)
Creatinine, Ser: 1.85 mg/dL — ABNORMAL HIGH (ref 0.61–1.24)
GFR, Estimated: 39 mL/min — ABNORMAL LOW (ref >60)
Glucose, Bld: 114 mg/dL — ABNORMAL HIGH (ref 70–99)
Potassium: 4.7 mmol/L (ref 3.5–5.1)
Sodium: 139 mmol/L (ref 135–145)

## 2023-09-29 LAB — CBC
HCT: 36.1 % — ABNORMAL LOW (ref 39.0–52.0)
Hemoglobin: 11.4 g/dL — ABNORMAL LOW (ref 13.0–17.0)
MCH: 28.7 pg (ref 26.0–34.0)
MCHC: 31.6 g/dL (ref 30.0–36.0)
MCV: 90.9 fL (ref 80.0–100.0)
Platelets: 250 10*3/uL (ref 150–400)
RBC: 3.97 MIL/uL — ABNORMAL LOW (ref 4.22–5.81)
RDW: 13.2 % (ref 11.5–15.5)
WBC: 8.3 10*3/uL (ref 4.0–10.5)
nRBC: 0 % (ref 0.0–0.2)

## 2023-09-29 LAB — GLUCOSE, CAPILLARY: Glucose-Capillary: 120 mg/dL — ABNORMAL HIGH (ref 70–99)

## 2023-10-05 ENCOUNTER — Encounter (HOSPITAL_COMMUNITY): Payer: Self-pay | Admitting: Urology

## 2023-10-05 NOTE — Anesthesia Preprocedure Evaluation (Addendum)
Anesthesia Evaluation  Patient identified by MRN, date of birth, ID band Patient awake    Reviewed: Allergy & Precautions, NPO status , Patient's Chart, lab work & pertinent test results  History of Anesthesia Complications (+) PONV and history of anesthetic complications  Airway Mallampati: III  TM Distance: >3 FB Neck ROM: Full    Dental no notable dental hx. (+) Teeth Intact, Dental Advisory Given   Pulmonary sleep apnea and Continuous Positive Airway Pressure Ventilation    Pulmonary exam normal breath sounds clear to auscultation       Cardiovascular hypertension, Pt. on medications Normal cardiovascular exam Rhythm:Regular Rate:Normal  EKG 05/18/23 NSR, LAFB, low voltage in precordial leads, consider inferior MI   Neuro/Psych negative neurological ROS  negative psych ROS   GI/Hepatic negative GI ROS,,,  Endo/Other  diabetes, Well Controlled, Type 2  Obesity  Renal/GU Renal InsufficiencyRenal diseaseRight renal mass Hx/o renal calculi Lab Results      Component                Value               Date                                K                        4.7                 09/29/2023                          BUN                      39 (H)              09/29/2023                CREATININE               1.85 (H)            09/29/2023                GFRNONAA                 39 (L)              09/29/2023            negative genitourinary   Musculoskeletal negative musculoskeletal ROS (+)    Abdominal   Peds  Hematology  (+) Blood dyscrasia, anemia Lab Results      Component                Value               Date                      WBC                      8.3                 09/29/2023                HGB                      11.4 (L)  09/29/2023                HCT                      36.1 (L)            09/29/2023                MCV                      90.9                09/29/2023                 PLT                      250                 09/29/2023              Anesthesia Other Findings   Reproductive/Obstetrics                             Anesthesia Physical Anesthesia Plan  ASA: 3  Anesthesia Plan: General   Post-op Pain Management: Ofirmev IV (intra-op)* and Ketamine IV*   Induction: Intravenous  PONV Risk Score and Plan: 4 or greater and Treatment may vary due to age or medical condition, Dexamethasone and Ondansetron  Airway Management Planned: Oral ETT  Additional Equipment: Arterial line  Intra-op Plan:   Post-operative Plan: Extubation in OR  Informed Consent: I have reviewed the patients History and Physical, chart, labs and discussed the procedure including the risks, benefits and alternatives for the proposed anesthesia with the patient or authorized representative who has indicated his/her understanding and acceptance.     Dental advisory given  Plan Discussed with:   Anesthesia Plan Comments: (2 IV's preferably 18g or larger)        Anesthesia Quick Evaluation

## 2023-10-05 NOTE — Progress Notes (Addendum)
Called pt on 10/05/2023 to see if pt had contacted MD in regards to Insulin Pump instructions in regards to surgery on 10/07/2023.  Pt Stated he had not contacted MD.  PT states" My doctor does not handle my pump.  I handle it myself. "  Asked pt what he was going to do in regards to Iinsulin Pump in regards to surgery and pt states"  he was going to take off Insulin Pump when he comes and he will monitor glucose preop.  PT states he plans to keep glucose around 150 and he has an injection he can give himself if his glucose drops too low".   Paged Diabetic Coordinator to let her be aware of above.  Carollee Herter, Diabetic Coordinator made aware of above.  No new orders given.

## 2023-10-07 ENCOUNTER — Encounter (HOSPITAL_COMMUNITY)
Admission: RE | Disposition: A | Discharge status: Home / Self Care | Payer: Self-pay | Source: Home / Self Care | Attending: Urology

## 2023-10-07 ENCOUNTER — Inpatient Hospital Stay (HOSPITAL_COMMUNITY)
Admission: RE | Admit: 2023-10-07 | Discharge: 2023-10-09 | DRG: 658 | Disposition: A | Discharge status: Home / Self Care | Payer: Medicare Other | Attending: Urology | Admitting: Urology

## 2023-10-07 ENCOUNTER — Inpatient Hospital Stay (HOSPITAL_COMMUNITY): Payer: Medicare Other | Admitting: Physician Assistant

## 2023-10-07 ENCOUNTER — Encounter (HOSPITAL_COMMUNITY): Payer: Self-pay | Admitting: Urology

## 2023-10-07 ENCOUNTER — Inpatient Hospital Stay (HOSPITAL_COMMUNITY): Payer: Medicare Other | Admitting: Anesthesiology

## 2023-10-07 ENCOUNTER — Other Ambulatory Visit: Payer: Self-pay

## 2023-10-07 DIAGNOSIS — G4733 Obstructive sleep apnea (adult) (pediatric): Secondary | ICD-10-CM | POA: Diagnosis present

## 2023-10-07 DIAGNOSIS — Z79899 Other long term (current) drug therapy: Secondary | ICD-10-CM

## 2023-10-07 DIAGNOSIS — Z9641 Presence of insulin pump (external) (internal): Secondary | ICD-10-CM | POA: Diagnosis present

## 2023-10-07 DIAGNOSIS — N2889 Other specified disorders of kidney and ureter: Secondary | ICD-10-CM | POA: Diagnosis present

## 2023-10-07 DIAGNOSIS — R112 Nausea with vomiting, unspecified: Secondary | ICD-10-CM | POA: Diagnosis not present

## 2023-10-07 DIAGNOSIS — E785 Hyperlipidemia, unspecified: Secondary | ICD-10-CM | POA: Diagnosis present

## 2023-10-07 DIAGNOSIS — E119 Type 2 diabetes mellitus without complications: Secondary | ICD-10-CM | POA: Diagnosis present

## 2023-10-07 DIAGNOSIS — N1832 Chronic kidney disease, stage 3b: Secondary | ICD-10-CM | POA: Diagnosis present

## 2023-10-07 DIAGNOSIS — Z7982 Long term (current) use of aspirin: Secondary | ICD-10-CM

## 2023-10-07 DIAGNOSIS — Z888 Allergy status to other drugs, medicaments and biological substances status: Secondary | ICD-10-CM

## 2023-10-07 DIAGNOSIS — Z6839 Body mass index (BMI) 39.0-39.9, adult: Secondary | ICD-10-CM | POA: Diagnosis not present

## 2023-10-07 DIAGNOSIS — Z794 Long term (current) use of insulin: Secondary | ICD-10-CM | POA: Diagnosis not present

## 2023-10-07 DIAGNOSIS — Z01818 Encounter for other preprocedural examination: Secondary | ICD-10-CM

## 2023-10-07 DIAGNOSIS — I129 Hypertensive chronic kidney disease with stage 1 through stage 4 chronic kidney disease, or unspecified chronic kidney disease: Secondary | ICD-10-CM | POA: Diagnosis present

## 2023-10-07 DIAGNOSIS — C641 Malignant neoplasm of right kidney, except renal pelvis: Principal | ICD-10-CM | POA: Diagnosis present

## 2023-10-07 HISTORY — PX: ROBOT ASSISTED LAPAROSCOPIC NEPHRECTOMY: SHX5140

## 2023-10-07 LAB — GLUCOSE, CAPILLARY
Glucose-Capillary: 205 mg/dL — ABNORMAL HIGH (ref 70–99)
Glucose-Capillary: 171 mg/dL — ABNORMAL HIGH (ref 70–99)
Glucose-Capillary: 215 mg/dL — ABNORMAL HIGH (ref 70–99)
Glucose-Capillary: 190 mg/dL — ABNORMAL HIGH (ref 70–99)
Glucose-Capillary: 263 mg/dL — ABNORMAL HIGH (ref 70–99)
Glucose-Capillary: 303 mg/dL — ABNORMAL HIGH (ref 70–99)

## 2023-10-07 LAB — TYPE AND SCREEN
ABO/RH(D): O POS
Antibody Screen: NEGATIVE

## 2023-10-07 LAB — ABO/RH: ABO/RH(D): O POS

## 2023-10-07 LAB — HEMOGLOBIN AND HEMATOCRIT, BLOOD
HCT: 34.4 % — ABNORMAL LOW (ref 39.0–52.0)
Hemoglobin: 11.1 g/dL — ABNORMAL LOW (ref 13.0–17.0)

## 2023-10-07 SURGERY — NEPHRECTOMY, RADICAL, ROBOT-ASSISTED, LAPAROSCOPIC, ADULT
Anesthesia: General | Site: Abdomen | Laterality: Right

## 2023-10-07 MED ORDER — FENTANYL CITRATE (PF) 100 MCG/2ML IJ SOLN
INTRAMUSCULAR | Status: DC | PRN
  Administered 2023-10-07: 50 ug via INTRAVENOUS
  Administered 2023-10-07: 100 ug via INTRAVENOUS

## 2023-10-07 MED ORDER — ONDANSETRON HCL 4 MG/2ML IJ SOLN
INTRAMUSCULAR | Status: DC | PRN
  Administered 2023-10-07: 4 mg via INTRAVENOUS

## 2023-10-07 MED ORDER — DIPHENHYDRAMINE HCL 12.5 MG/5ML PO ELIX: 12.5000 mg | ORAL_SOLUTION | Freq: Four times a day (QID) | ORAL | Status: DC | PRN

## 2023-10-07 MED ORDER — INSULIN ASPART 100 UNIT/ML IJ SOLN
INTRAMUSCULAR | Status: AC
  Filled 2023-10-07: qty 1

## 2023-10-07 MED ORDER — DROPERIDOL 2.5 MG/ML IJ SOLN: 0.6250 mg | Freq: Once | INTRAMUSCULAR | Status: DC | PRN

## 2023-10-07 MED ORDER — OXYCODONE HCL 5 MG PO TABS
5.0000 mg | ORAL_TABLET | ORAL | 0 refills | Status: AC | PRN
Start: 2023-10-07 — End: ?

## 2023-10-07 MED ORDER — ALBUMIN HUMAN 5 % IV SOLN
INTRAVENOUS | Status: AC
  Filled 2023-10-07: qty 250

## 2023-10-07 MED ORDER — BUPIVACAINE LIPOSOME 1.3 % IJ SUSP
INTRAMUSCULAR | Status: DC | PRN
  Administered 2023-10-07: 20 mL

## 2023-10-07 MED ORDER — HYOSCYAMINE SULFATE 0.125 MG SL SUBL: 0.1250 mg | SUBLINGUAL_TABLET | SUBLINGUAL | Status: DC | PRN

## 2023-10-07 MED ORDER — ALBUMIN HUMAN 5 % IV SOLN
12.5000 g | Freq: Once | INTRAVENOUS | Status: AC
  Administered 2023-10-07: 12.5 g via INTRAVENOUS

## 2023-10-07 MED ORDER — OXYCODONE HCL 5 MG PO TABS: 5.0000 mg | ORAL_TABLET | ORAL | Status: DC | PRN

## 2023-10-07 MED ORDER — DOCUSATE SODIUM 100 MG PO CAPS: 100.0000 mg | ORAL_CAPSULE | Freq: Two times a day (BID) | ORAL | Status: AC

## 2023-10-07 MED ORDER — DEXAMETHASONE SODIUM PHOSPHATE 10 MG/ML IJ SOLN
INTRAMUSCULAR | Status: AC
  Filled 2023-10-07: qty 1

## 2023-10-07 MED ORDER — ACETAMINOPHEN 10 MG/ML IV SOLN: 1000.0000 mg | Freq: Once | INTRAVENOUS | Status: DC | PRN

## 2023-10-07 MED ORDER — PROPOFOL 10 MG/ML IV BOLUS
INTRAVENOUS | Status: DC | PRN
  Administered 2023-10-07: 130 mg via INTRAVENOUS

## 2023-10-07 MED ORDER — SODIUM CHLORIDE (PF) 0.9 % IJ SOLN
INTRAMUSCULAR | Status: AC
  Filled 2023-10-07: qty 20

## 2023-10-07 MED ORDER — HYDROMORPHONE HCL 1 MG/ML IJ SOLN
0.5000 mg | INTRAMUSCULAR | Status: DC | PRN
  Administered 2023-10-07: 1 mg via INTRAVENOUS
  Filled 2023-10-07: qty 1

## 2023-10-07 MED ORDER — CHLORHEXIDINE GLUCONATE 0.12 % MT SOLN
15.0000 mL | Freq: Once | OROMUCOSAL | Status: AC
  Administered 2023-10-07: 15 mL via OROMUCOSAL

## 2023-10-07 MED ORDER — OXYCODONE HCL 5 MG/5ML PO SOLN: 5.0000 mg | Freq: Once | ORAL | Status: DC | PRN

## 2023-10-07 MED ORDER — ORAL CARE MOUTH RINSE: 15.0000 mL | OROMUCOSAL | Status: DC | PRN

## 2023-10-07 MED ORDER — LACTATED RINGERS IV SOLN: INTRAVENOUS | Status: DC

## 2023-10-07 MED ORDER — EPHEDRINE SULFATE (PRESSORS) 50 MG/ML IJ SOLN
INTRAMUSCULAR | Status: DC | PRN
Start: 2023-10-07 — End: 2023-10-07
  Administered 2023-10-07: 5 mg via INTRAVENOUS
  Administered 2023-10-07: 10 mg via INTRAVENOUS
  Administered 2023-10-07: 5 mg via INTRAVENOUS
  Administered 2023-10-07: 10 mg via INTRAVENOUS

## 2023-10-07 MED ORDER — PROPOFOL 10 MG/ML IV BOLUS
INTRAVENOUS | Status: AC
  Filled 2023-10-07: qty 20

## 2023-10-07 MED ORDER — HYDROMORPHONE HCL 1 MG/ML IJ SOLN
0.2500 mg | INTRAMUSCULAR | Status: DC | PRN
  Administered 2023-10-07 (×4): 0.25 mg via INTRAVENOUS

## 2023-10-07 MED ORDER — PHENYLEPHRINE HCL (PRESSORS) 10 MG/ML IV SOLN
INTRAVENOUS | Status: DC | PRN
Start: 2023-10-07 — End: 2023-10-07
  Administered 2023-10-07 (×2): 120 ug via INTRAVENOUS

## 2023-10-07 MED ORDER — OXYCODONE HCL 5 MG PO TABS: 5.0000 mg | ORAL_TABLET | Freq: Once | ORAL | Status: DC | PRN

## 2023-10-07 MED ORDER — DOCUSATE SODIUM 100 MG PO CAPS
100.0000 mg | ORAL_CAPSULE | Freq: Two times a day (BID) | ORAL | Status: DC
  Administered 2023-10-08 – 2023-10-09 (×3): 100 mg via ORAL
  Filled 2023-10-07 (×4): qty 1

## 2023-10-07 MED ORDER — FLEET ENEMA RE ENEM: 1.0000 | ENEMA | Freq: Once | RECTAL | Status: DC

## 2023-10-07 MED ORDER — SODIUM CHLORIDE 0.45 % IV SOLN: INTRAVENOUS | Status: DC

## 2023-10-07 MED ORDER — DEXAMETHASONE SODIUM PHOSPHATE 10 MG/ML IJ SOLN
INTRAMUSCULAR | Status: DC | PRN
Start: 2023-10-07 — End: 2023-10-07
  Administered 2023-10-07: 5 mg via INTRAVENOUS

## 2023-10-07 MED ORDER — MAGNESIUM CITRATE PO SOLN: 1.0000 | Freq: Once | ORAL | Status: DC

## 2023-10-07 MED ORDER — SODIUM CHLORIDE (PF) 0.9 % IJ SOLN
INTRAMUSCULAR | Status: DC | PRN
  Administered 2023-10-07: 20 mL

## 2023-10-07 MED ORDER — DIPHENHYDRAMINE HCL 50 MG/ML IJ SOLN: 12.5000 mg | Freq: Four times a day (QID) | INTRAMUSCULAR | Status: DC | PRN

## 2023-10-07 MED ORDER — ACETAMINOPHEN 500 MG PO TABS
1000.0000 mg | ORAL_TABLET | Freq: Four times a day (QID) | ORAL | Status: AC
  Administered 2023-10-07 – 2023-10-08 (×2): 1000 mg via ORAL
  Filled 2023-10-07 (×2): qty 2

## 2023-10-07 MED ORDER — ATORVASTATIN CALCIUM 40 MG PO TABS
40.0000 mg | ORAL_TABLET | Freq: Every day | ORAL | Status: DC
  Administered 2023-10-08: 40 mg via ORAL
  Filled 2023-10-07 (×2): qty 1

## 2023-10-07 MED ORDER — INSULIN ASPART 100 UNIT/ML IJ SOLN
0.0000 [IU] | Freq: Three times a day (TID) | INTRAMUSCULAR | Status: DC
  Administered 2023-10-07: 11 [IU] via SUBCUTANEOUS
  Administered 2023-10-08: 3 [IU] via SUBCUTANEOUS
  Administered 2023-10-08 (×2): 8 [IU] via SUBCUTANEOUS
  Administered 2023-10-09: 3 [IU] via SUBCUTANEOUS

## 2023-10-07 MED ORDER — PHENYLEPHRINE HCL-NACL 20-0.9 MG/250ML-% IV SOLN
INTRAVENOUS | Status: DC | PRN
Start: 2023-10-07 — End: 2023-10-07
  Administered 2023-10-07: 50 ug/min via INTRAVENOUS

## 2023-10-07 MED ORDER — FENTANYL CITRATE (PF) 250 MCG/5ML IJ SOLN
INTRAMUSCULAR | Status: AC
  Filled 2023-10-07: qty 5

## 2023-10-07 MED ORDER — KETAMINE HCL 10 MG/ML IJ SOLN
INTRAMUSCULAR | Status: DC | PRN
Start: 2023-10-07 — End: 2023-10-07
  Administered 2023-10-07: 30 mg via INTRAVENOUS

## 2023-10-07 MED ORDER — SUGAMMADEX SODIUM 200 MG/2ML IV SOLN
INTRAVENOUS | Status: DC | PRN
Start: 2023-10-07 — End: 2023-10-07
  Administered 2023-10-07: 200 mg via INTRAVENOUS

## 2023-10-07 MED ORDER — ORAL CARE MOUTH RINSE: 15.0000 mL | Freq: Once | OROMUCOSAL | Status: AC

## 2023-10-07 MED ORDER — ONDANSETRON HCL 4 MG/2ML IJ SOLN: 4.0000 mg | Freq: Once | INTRAMUSCULAR | Status: DC | PRN

## 2023-10-07 MED ORDER — INSULIN ASPART 100 UNIT/ML IJ SOLN: 0.0000 [IU] | Freq: Three times a day (TID) | INTRAMUSCULAR | Status: DC

## 2023-10-07 MED ORDER — AMLODIPINE BESYLATE 10 MG PO TABS
10.0000 mg | ORAL_TABLET | Freq: Every day | ORAL | Status: DC
  Administered 2023-10-08 – 2023-10-09 (×2): 10 mg via ORAL
  Filled 2023-10-07 (×2): qty 1

## 2023-10-07 MED ORDER — LACTATED RINGERS IR SOLN: Status: DC | PRN

## 2023-10-07 MED ORDER — LIDOCAINE HCL (PF) 2 % IJ SOLN
INTRAMUSCULAR | Status: AC
  Filled 2023-10-07: qty 5

## 2023-10-07 MED ORDER — ROCURONIUM BROMIDE 10 MG/ML (PF) SYRINGE
PREFILLED_SYRINGE | INTRAVENOUS | Status: AC
  Filled 2023-10-07: qty 10

## 2023-10-07 MED ORDER — KETAMINE HCL 50 MG/5ML IJ SOSY
PREFILLED_SYRINGE | INTRAMUSCULAR | Status: AC
  Filled 2023-10-07: qty 5

## 2023-10-07 MED ORDER — CEFAZOLIN SODIUM-DEXTROSE 2-4 GM/100ML-% IV SOLN
2.0000 g | INTRAVENOUS | Status: AC
  Administered 2023-10-07: 2 g via INTRAVENOUS
  Filled 2023-10-07: qty 100

## 2023-10-07 MED ORDER — ONDANSETRON HCL 4 MG/2ML IJ SOLN
4.0000 mg | INTRAMUSCULAR | Status: DC | PRN
  Administered 2023-10-07 – 2023-10-08 (×3): 4 mg via INTRAVENOUS
  Filled 2023-10-07 (×3): qty 2

## 2023-10-07 MED ORDER — ROCURONIUM BROMIDE 100 MG/10ML IV SOLN
INTRAVENOUS | Status: DC | PRN
  Administered 2023-10-07: 20 mg via INTRAVENOUS
  Administered 2023-10-07: 15 mg via INTRAVENOUS
  Administered 2023-10-07: 80 mg via INTRAVENOUS
  Administered 2023-10-07: 20 mg via INTRAVENOUS

## 2023-10-07 MED ORDER — BUPIVACAINE LIPOSOME 1.3 % IJ SUSP
INTRAMUSCULAR | Status: AC
  Filled 2023-10-07: qty 20

## 2023-10-07 MED ORDER — LIDOCAINE HCL (CARDIAC) PF 100 MG/5ML IV SOSY
PREFILLED_SYRINGE | INTRAVENOUS | Status: DC | PRN
  Administered 2023-10-07: 100 mg via INTRAVENOUS

## 2023-10-07 MED ORDER — INSULIN ASPART 100 UNIT/ML IJ SOLN
0.0000 [IU] | INTRAMUSCULAR | Status: AC | PRN
  Administered 2023-10-07: 2 [IU] via SUBCUTANEOUS
  Administered 2023-10-07: 4 [IU] via SUBCUTANEOUS
  Administered 2023-10-07: 2 [IU] via SUBCUTANEOUS

## 2023-10-07 MED ORDER — STERILE WATER FOR IRRIGATION IR SOLN
Status: DC | PRN
  Administered 2023-10-07: 1000 mL

## 2023-10-07 MED ORDER — HYDROMORPHONE HCL 1 MG/ML IJ SOLN
INTRAMUSCULAR | Status: AC
  Filled 2023-10-07: qty 1

## 2023-10-07 SURGICAL SUPPLY — 79 items
ADH SKN CLS APL DERMABOND .7 (GAUZE/BANDAGES/DRESSINGS) ×1
AGENT HMST KT MTR STRL THRMB (HEMOSTASIS) ×1
APL ESCP 34 STRL LF DISP (HEMOSTASIS) ×1
APL LAPSCP 35 DL APL RGD (MISCELLANEOUS) ×1
APL PRP STRL LF DISP 70% ISPRP (MISCELLANEOUS) ×1
APPLICATOR SURGIFLO ENDO (HEMOSTASIS) IMPLANT
APPLICATOR VISTASEAL 35 (MISCELLANEOUS) ×1 IMPLANT
BAG COUNTER SPONGE SURGICOUNT (BAG) ×1 IMPLANT
BAG LAPAROSCOPIC 12 15 PORT 16 (BASKET) ×1 IMPLANT
BAG RETRIEVAL 12/15 (BASKET) ×1
BAG SPNG CNTER NS LX DISP (BAG) ×1
CHLORAPREP W/TINT 26 (MISCELLANEOUS) ×1 IMPLANT
CLIP LIGATING HEM O LOK PURPLE (MISCELLANEOUS) ×1 IMPLANT
CLIP LIGATING HEMO LOK XL GOLD (MISCELLANEOUS) ×1 IMPLANT
CLIP LIGATING HEMO O LOK GREEN (MISCELLANEOUS) ×1 IMPLANT
CLIP SUT LAPRA TY ABSORB (SUTURE) ×3 IMPLANT
COVER SURGICAL LIGHT HANDLE (MISCELLANEOUS) ×1 IMPLANT
COVER TIP SHEARS 8 DVNC (MISCELLANEOUS) ×1 IMPLANT
DERMABOND ADVANCED .7 DNX12 (GAUZE/BANDAGES/DRESSINGS) ×2 IMPLANT
DRAIN CHANNEL 15F RND FF 3/16 (WOUND CARE) IMPLANT
DRAPE ARM DVNC X/XI (DISPOSABLE) ×4 IMPLANT
DRAPE COLUMN DVNC XI (DISPOSABLE) ×1 IMPLANT
DRAPE INCISE IOBAN 66X45 STRL (DRAPES) ×1 IMPLANT
DRAPE SHEET LG 3/4 BI-LAMINATE (DRAPES) ×1 IMPLANT
DRIVER NDL LRG 8 DVNC XI (INSTRUMENTS) ×2 IMPLANT
DRIVER NDLE LRG 8 DVNC XI (INSTRUMENTS) ×2
DRSG TEGADERM 4X4.75 (GAUZE/BANDAGES/DRESSINGS) ×1 IMPLANT
ELECT PENCIL ROCKER SW 15FT (MISCELLANEOUS) ×1 IMPLANT
ELECT REM PT RETURN 15FT ADLT (MISCELLANEOUS) ×1 IMPLANT
EVACUATOR SILICONE 100CC (DRAIN) IMPLANT
FORCEPS BPLR FENES DVNC XI (FORCEP) ×1 IMPLANT
FORCEPS PROGRASP DVNC XI (FORCEP) ×1 IMPLANT
GLOVE BIO SURGEON STRL SZ 6.5 (GLOVE) ×1 IMPLANT
GLOVE BIOGEL M 7.0 STRL (GLOVE) ×2 IMPLANT
GLOVE BIOGEL PI IND STRL 7.0 (GLOVE) ×1 IMPLANT
GOWN STRL REUS W/ TWL XL LVL3 (GOWN DISPOSABLE) ×2 IMPLANT
GOWN STRL REUS W/TWL XL LVL3 (GOWN DISPOSABLE) ×2
GOWN STRL SURGICAL XL XLNG (GOWN DISPOSABLE) ×1 IMPLANT
HOLDER FOLEY CATH W/STRAP (MISCELLANEOUS) ×1 IMPLANT
IRRIG SUCT STRYKERFLOW 2 WTIP (MISCELLANEOUS) ×1
IRRIGATION SUCT STRKRFLW 2 WTP (MISCELLANEOUS) ×1 IMPLANT
KIT BASIN OR (CUSTOM PROCEDURE TRAY) ×1 IMPLANT
KIT TURNOVER KIT A (KITS) IMPLANT
LOOP VESSEL MAXI BLUE (MISCELLANEOUS) IMPLANT
MARKER SKIN DUAL TIP RULER LAB (MISCELLANEOUS) ×1 IMPLANT
NDL INSUFFLATION 14GA 120MM (NEEDLE) IMPLANT
NEEDLE INSUFFLATION 14GA 120MM (NEEDLE)
PATTIES SURGICAL .5 X3 (DISPOSABLE) IMPLANT
PROTECTOR NERVE ULNAR (MISCELLANEOUS) ×2 IMPLANT
RELOAD STAPLE 45 2.6 WHT THIN (STAPLE) IMPLANT
SCISSORS LAP 5X45 EPIX DISP (ENDOMECHANICALS) ×1 IMPLANT
SCISSORS MNPLR CVD DVNC XI (INSTRUMENTS) ×1 IMPLANT
SEAL UNIV 5-12 XI (MISCELLANEOUS) ×3 IMPLANT
SEALER VESSEL EXT DVNC XI (MISCELLANEOUS) IMPLANT
SET TUBE SMOKE EVAC HIGH FLOW (TUBING) ×1 IMPLANT
SOL ELECTROSURG ANTI STICK (MISCELLANEOUS) ×1
SOLUTION ELECTROSURG ANTI STCK (MISCELLANEOUS) ×1 IMPLANT
SPIKE FLUID TRANSFER (MISCELLANEOUS) ×1 IMPLANT
SPONGE T-LAP 4X18 ~~LOC~~+RFID (SPONGE) IMPLANT
STAPLE RELOAD 45 WHT (STAPLE) ×4
STAPLER POWER ECHELON 45 WIDE (STAPLE) IMPLANT
SURGIFLO W/THROMBIN 8M KIT (HEMOSTASIS) ×1 IMPLANT
SUT ETHILON 3 0 PS 1 (SUTURE) IMPLANT
SUT MNCRL AB 4-0 PS2 18 (SUTURE) ×2 IMPLANT
SUT PDS AB 1 CT1 27 (SUTURE) ×1 IMPLANT
SUT VIC AB 0 CT1 27 (SUTURE)
SUT VIC AB 0 CT1 27XBRD ANTBC (SUTURE) IMPLANT
SUT VIC AB 2-0 CT1 27 (SUTURE) ×1
SUT VIC AB 2-0 CT1 27XBRD (SUTURE) IMPLANT
SUT VIC AB 2-0 SH 27 (SUTURE) ×7
SUT VIC AB 2-0 SH 27X BRD (SUTURE) ×4 IMPLANT
SUT VICRYL 0 UR6 27IN ABS (SUTURE) ×1 IMPLANT
SUT VLOC 3-0 9IN GRN (SUTURE) ×2 IMPLANT
TOWEL OR 17X26 10 PK STRL BLUE (TOWEL DISPOSABLE) ×1 IMPLANT
TRAY FOLEY MTR SLVR 16FR STAT (SET/KITS/TRAYS/PACK) ×1 IMPLANT
TRAY LAPAROSCOPIC (CUSTOM PROCEDURE TRAY) ×1 IMPLANT
TROCAR Z THREAD OPTICAL 12X100 (TROCAR) ×1 IMPLANT
TROCAR Z-THREAD OPTICAL 5X100M (TROCAR) IMPLANT
WATER STERILE IRR 1000ML POUR (IV SOLUTION) ×1 IMPLANT

## 2023-10-07 NOTE — Discharge Instructions (Signed)

## 2023-10-07 NOTE — Anesthesia Procedure Notes (Signed)
Procedure Name: Intubation Date/Time: 10/07/2023 7:45 AM  Performed by: Chinita Pester, CRNAPre-anesthesia Checklist: Patient identified, Emergency Drugs available, Suction available and Patient being monitored Patient Re-evaluated:Patient Re-evaluated prior to induction Oxygen Delivery Method: Circle System Utilized Preoxygenation: Pre-oxygenation with 100% oxygen Induction Type: IV induction Ventilation: Mask ventilation without difficulty Laryngoscope Size: 4 Grade View: Grade I Tube type: Oral Tube size: 7.5 mm Number of attempts: 1 Airway Equipment and Method: Stylet and Oral airway Placement Confirmation: ETT inserted through vocal cords under direct vision, positive ETCO2 and breath sounds checked- equal and bilateral Secured at: 21 cm Tube secured with: Tape Dental Injury: Teeth and Oropharynx as per pre-operative assessment

## 2023-10-07 NOTE — Anesthesia Postprocedure Evaluation (Signed)
Anesthesia Post Note  Patient: BRANON SABINE  Procedure(s) Performed: XI RIGHT ROBOTIC ASSISTED LAPAROSCOPIC RADICAL NEPHRECTOMY (Right: Abdomen)     Patient location during evaluation: PACU Anesthesia Type: General Level of consciousness: awake and alert Pain management: pain level controlled Vital Signs Assessment: post-procedure vital signs reviewed and stable Respiratory status: spontaneous breathing, nonlabored ventilation, respiratory function stable and patient connected to nasal cannula oxygen Cardiovascular status: blood pressure returned to baseline and stable Postop Assessment: no apparent nausea or vomiting Anesthetic complications: no   No notable events documented.  Last Vitals:  Vitals:   10/07/23 1315 10/07/23 1330  BP: (!) 98/50 (!) 100/50  Pulse: (!) 57 (!) 59  Resp: 14 14  Temp:    SpO2: 100% 100%    Last Pain:  Vitals:   10/07/23 1330  TempSrc:   PainSc: Asleep                 Trevor Iha

## 2023-10-07 NOTE — H&P (Signed)
Office Visit Report 09/27/2023    [name]         MRN: [id number]  PRIMARY CARE:  Maisie Fus L. Martha Clan, MD    PRIMARY CARE FAX:  [phone]    REFERRING:  Jannifer Hick, MD    PROVIDER:  Jettie Pagan, M.D.  DOB: [birth year], 72 year old Male  TREATING:  Jorge Alexander, [name]  SSN:   LOCATION:  [facility] [phone]    CC/HPI: Pt presents today for pre-operative history and physical exam in anticipation of robotic assisted lap radical vs partial right nephrectomy by Dr. Cardell Peach on 10/07/23. He is doing well and is without complaint.   Pt denies F/C, HA, CP, SOB, N/V, diarrhea/constipation, back pain, flank pain, hematuria, and dysuria.    HX:    Jorge Alexander is a 72 year old male seen in follow-up today with urolithiasis and right renal mass.   1. Urolithiasis:  -He has had a history of urolithiasis and underwent ureteroscopy in the past in [facility] remotely.  He presented to the ED on 05/18/2023 with left-sided flank pain. He was found to have 2 left ureteral stones including a 2 mm and 4 mm distal left ureteral stone. He also had nonobstructing bilateral stones measuring up to 11 mm in the right lower pole and punctate on the left. He underwent left ureteral stent placement on 05/18/2023 with Dr. Sherron Monday.  -S/p L URS/LL, L stent 07/11/2023 for left ureteral stones. He removed his stent postoperatively. Stone analysis 80% uric acid, 20% calcium oxalate.  -Renal ultrasound 08/09/2023 with mild left hydronephrosis.  -He denies abdominal pain or flank pain.   #2. Right renal mass:  -CT A/P 05/17/2023 revealed indeterminate 3.1 cm heterogenous right renal lesion.  -MRI abdomen 08/04/2023 with 3.2 x 3.2 cm heterogenously enhancing mass from anterior midportion of the right kidney. No evidence of renal vein invasion, lymphadenopathy or metastatic disease. He does have prominent portacaval lymph nodes that are unchanged and likely reactive.  He denies abdominal pain or  flank pain. He denies family history of urologic malignancy.  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 Most recent creatinine in 06/2023 is 1.58 equating to GFR 47. Of note, his left kidney appears more atrophic compared to the right. He does have numerous additional benign-appearing renal cysts bilaterally.   He has a past medical history of hypertension, diabetes, obstructive sleep apnea, CKD 3B. Hemoglobin A1c in 04/2023 was 7.9. He is morbidly obese and weighs 248 pounds corresponding to BMI of 39.     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  obstructive sleep apnea  CKD 3B.  He is morbidly obese and weighs 248 pounds corresponding to BMI of 39.     NON-GU PMH: None    FAMILY  HISTORY: None    SOCIAL HISTORY: Marital Status: Married Current Smoking Status: Patient has never smoked.  <DIV'  Tobacco Use Assessment Completed:  Used Tobacco in last 30 days?   Does not use smokeless tobacco. Has never drank.  Does not use drugs. Drinks 2 caffeinated drinks per day. Has not had a blood transfusion.     REVIEW OF SYSTEMS:     GU Review Male:  Patient denies have to strain to urinate , hard to postpone urination, get up at night to urinate, burning/ pain with urination, frequent urination, stream starts and stops, trouble starting your stream, leakage of urine, penile pain, and  erection problems.    Gastrointestinal (Upper):  Patient denies nausea, vomiting, and indigestion/ heartburn.    Gastrointestinal (Lower):  Patient denies diarrhea and constipation.    Constitutional:  Patient denies fever, night sweats, weight loss, and fatigue.    Skin:  Patient denies skin rash/ lesion and itching.    Eyes:  Patient denies blurred vision and double vision.    Ears/ Nose/ Throat:  Patient denies sore throat and sinus problems.    Hematologic/Lymphatic:  Patient denies swollen glands and easy bruising.    Cardiovascular:  Patient denies leg swelling and chest pains.    Respiratory:  Patient denies cough and shortness of breath.    Endocrine:  Patient denies excessive thirst.    Musculoskeletal:  Patient denies back pain and joint pain.    Neurological:  Patient denies headaches and dizziness.    Psychologic:  Patient denies depression and anxiety.    VITAL SIGNS:       09/27/2023 02:18 PM     BP 119/72 mmHg     Pulse 86 /min     Temperature 97.7 F / 36.5 C     MULTI-SYSTEM PHYSICAL EXAMINATION:      Constitutional: Well-nourished. No physical deformities. Normally developed. Good grooming.     Neck: Neck symmetrical, not swollen. Normal tracheal position.     Respiratory: Normal breath sounds. No labored breathing, no use of accessory muscles.      Cardiovascular: Regular rate and rhythm. No murmur, no gallop.      Lymphatic: No enlargement of neck, axillae, groin.     Skin: No paleness, no jaundice, no cyanosis. No lesion, no ulcer, no rash.     Neurologic / Psychiatric: Oriented to time, oriented to place, oriented to person. No depression, no anxiety, no agitation.     Gastrointestinal: No mass, no tenderness, no rigidity, obese abdomen.      Eyes: Normal conjunctivae. Normal eyelids.     Ears, Nose, Mouth, and Throat: Left ear no scars, no lesions, no masses. Right ear no scars, no lesions, no masses. Nose no scars, no lesions, no masses. Normal hearing. Normal  lips.     Musculoskeletal: Normal gait and station of head and neck.            Complexity of Data:   Records Review:  Previous Patient Records  Urine Test Review:  Urinalysis    09/27/23  Urinalysis  Urine Appearance Clear   Urine Color Yellow   Urine Glucose Neg mg/dL  Urine Bilirubin Neg mg/dL  Urine Ketones Neg mg/dL  Urine Specific Gravity 1.025   Urine Blood Trace ery/uL  Urine pH <=5.0   Urine Protein Trace mg/dL  Urine Urobilinogen 0.2 mg/dL  Urine Nitrites Neg   Urine Leukocyte Esterase Neg leu/uL  Urine WBC/hpf 6 - 10/hpf   Urine RBC/hpf 0 -  2/hpf   Urine Epithelial Cells 0 - 5/hpf   Urine Bacteria Few (10-25/hpf)   Urine Mucous Present   Urine Yeast NS (Not Seen)   Urine Trichomonas Not Present   Urine Cystals NS (Not Seen)   Urine Casts Hyaline   Urine Sperm Not Present    PROCEDURES:    Urinalysis w/Scope - 81001  Dipstick Dipstick Cont'd Micro  Color: Yellow Bilirubin: Neg mg/dL WBC/hpf: 6 - 40/JWJ  Appearance: Clear Ketones: Neg mg/dL RBC/hpf: 0 - 2/hpf  Specific Gravity: 1.025 Blood: Trace ery/uL Bacteria: Few (10-25/hpf)  pH: <=5.0 Protein: Trace mg/dL Cystals: NS (Not Seen)  Glucose: Neg mg/dL Urobilinogen: 0.2 mg/dL Casts: Hyaline   Nitrites: Neg Trichomonas: Not Present   Leukocyte Esterase: Neg leu/uL Mucous: Present    Epithelial Cells: 0 - 5/hpf    Yeast: NS (Not Seen)    Sperm: Not Present    ASSESSMENT:     ICD-10 Details  1 GU:  Right renal neoplasm - 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 Cardell Peach. Pt is scheduled to undergo RAL right partial vs radical nephrectomy on 10/07/23.   Urine for culture.   All pt's questions were answered to the best of my ability.     Next  Appointment:    Next Appointment: 10/07/2023 07:30 AM   Appointment Type: Surgery    Location: [facility] [phone]   Provider: Jettie Pagan, M.D.   Reason for Visit: [facility] (R) RA LAP PARTIAL NEOHRECTOMY, POSS RADICAL NEPHRECTOMY WITH [facility]   Urology Preoperative H&P   Chief Complaint: Right renal mass  History of Present Illness: Jorge Alexander is a 72 y.o. male with a right renal mass here for robotic assisted laparoscopic right partial nephrectomy, possible radical nephrectomy. Denies fevers, chills, dysuria.    Past Medical History:  Diagnosis Date   Anemia    Cancer (HCC)    kidney cancer   Diabetes mellitus without complication (HCC) type 2    History of kidney stones    Hypertension    PONV (postoperative nausea and vomiting)    Sleep apnea    uses cpap seton 10 some nights    Past Surgical History:  Procedure Laterality Date   CYSTOSCOPY W/ STONE MANIPULATION Right 2011   CYSTOSCOPY WITH RETROGRADE PYELOGRAM, URETEROSCOPY AND STENT PLACEMENT Left 05/18/2023   Procedure: CYSTOSCOPY WITH RETROGRADE PYELOGRAM,  AND STENT PLACEMENT;  Surgeon: Alfredo Martinez, MD;  Location: [facility];  Service: Urology;  Laterality: Left;   CYSTOSCOPY/URETEROSCOPY/HOLMIUM LASER/STENT PLACEMENT Left 07/11/2023   Procedure: CYSTOSCOPY/LEFT RETROGRADE PYELOGRAM/LEFT URETEROSCOPY/LEFT STENT EXCHANGE STONE BASKET EXTRACTION;  Surgeon: Jannifer Hick, MD;  Location: [facility];  Service: Urology;  Laterality: Left;  45 MINUTES NEEDED FOR CASE   LITHOTRIPSY Right 2015    Allergies:  Allergies  Allergen Reactions   Liraglutide Nausea Only and Other (See Comments)    GI Intolerance (Victoza or Saxenda)    History reviewed. No pertinent family history.  Social History:  reports that he has never smoked. He has never used smokeless tobacco. He reports that he does not drink alcohol and does not use drugs.  ROS: A complete review of systems was performed.   All systems are negative  except for pertinent findings as noted.  Physical Exam:  Vital signs in last 24 hours: Temp:  [98.3 F (36.8 C)] 98.3 F (36.8 C) (10/11 0619) Pulse Rate:  [65] 65 (10/11 0619) Resp:  [18] 18 (10/11 0619) BP: (133)/(61) 133/61 (10/11 0619) SpO2:  [97 %] 97 % (10/11 0619) Weight:  [657 kg] 112 kg (10/11 0545) Constitutional:  Alert and oriented, No acute distress Cardiovascular: Regular rate and rhythm Respiratory: Normal respiratory effort, Lungs clear bilaterally GI: Abdomen is soft, nontender, nondistended, no abdominal masses GU: No CVA tenderness Lymphatic: No lymphadenopathy Neurologic: Grossly intact, no focal deficits Psychiatric: Normal mood and affect  Laboratory Data:  No results for input(s): "WBC", "HGB", "HCT", "PLT" in the last 72 hours.  No results for input(s): "NA", "K", "CL", "GLUCOSE", "BUN", "CALCIUM", "CREATININE" in the last 72 hours.  Invalid input(s): "CO3"   Results for orders placed or performed during the hospital encounter of 10/07/23 (from the past 24 hour(s))  Glucose, capillary     Status: Abnormal   Collection Time: 10/07/23  5:58 AM  Result Value Ref Range   Glucose-Capillary 205 (H) 70 - 99 mg/dL   No results found for this or any previous visit (from the past 240 hour(s)).  Renal Function: No results for input(s): "CREATININE" in the last 168 hours. Estimated Creatinine Clearance: 44.4 mL/min (A) (by C-G formula based on SCr of 1.85 mg/dL (H)).  Radiologic Imaging: No results found.  I independently reviewed the above imaging studies.  Assessment and Plan CADON KIRALY is a 72 y.o. male with a right renal mass here for robotic assisted laparoscopic right partial nephrectomy, possible radical nephrectomy.   We have discussed the risks of treatment in detail including but not limited to bleeding, infection, heart attack, stroke, death, venothromoboembolism, cancer recurrence, injury/damage to surrounding  organs and structures, urine leak, the possibility of open surgical conversion for patients undergoing minimally invasive surgery, the risk of developing chronic kidney disease and its associated implications, and the potential risk of end stage renal disease possibly necessitating dialysis.   Matt R. Anniemae Haberkorn MD 10/07/2023, 7:13 AM  [facility] Pager: [phone]): [phone]

## 2023-10-07 NOTE — Anesthesia Procedure Notes (Signed)
Arterial Line Insertion Start/End10/10/2023 8:15 AM, 10/07/2023 8:20 AM Performed by: Trevor Iha, MD, anesthesiologist  Patient location: OR. Preanesthetic checklist: patient identified, IV checked and risks and benefits discussed Right, radial was placed Catheter size: 20 G Hand hygiene performed  and Seldinger technique used  Attempts: 1 Procedure performed without using ultrasound guided technique. Following insertion, dressing applied. Post procedure assessment: normal  Patient tolerated the procedure well with no immediate complications.

## 2023-10-07 NOTE — Op Note (Signed)
Operative Note  Preoperative diagnosis:  1.  Right renal mass  Postoperative diagnosis: 1.  Right renal mass  Procedure(s): 1.  Robotic assisted laparoscopic right radical nephrectomy   Surgeon: Jettie Pagan, MD  Assistants:  None  Anesthesia:  General  Complications:  None  EBL:  50ml  Specimens: 1.  ID Type Source Tests Collected by Time Destination  1 : Right Kidney Tissue PATH GU biopsy SURGICAL PATHOLOGY Jannifer Hick, MD 10/07/2023 [phone]    Drains/Catheters: 1.  16 French foley catheter  Intraoperative findings:   Hilum had 1 right renal artery, 1 right renal vein. Toxic fat overlying kidney requiring about an hour to remove. Approximately 4cm deep right hilar renal mass, adjacent to lower pole right renal artery. Decision was made to remove the entire kidney in order to maintain negative margins.  Indication:  Jorge Alexander is a 72 y.o. male with a right renal mass presenting for a robotic assisted laparoscopic right partial vs radical nephrectomy.  Description of procedure: The indications, alternatives, benefits and risks were discussed with the patient and informed consent was obtained.  The patient was brought onto the operating room table, positioned supine and secured to the bed with a safety strap.  All pressure points were carefully padded and pneumatic compression devices were placed on the lower extremities.  After the administration of intravenous antibiotics and general endotracheal anesthesia, a 16 French urethral catheter was inserted to drain the bladder.  The patient was repositioned in the left lateral decubitus with the right side elevated at a 70 degree angle with the left lower leg flexed and the right upper leg extended.  An axillary roll was positioned to protect the brachial plexus and a gel pad was placed to support the back.  Multiple pillows were used to pad beneath and between the lower extremities and to ensure adequate cushioning. The right arm  was placed in an armrest and carefully padded. The patient was secured in place across the hips, chest and legs with foam padding and silk tape, and the table was flexed.  The patient was prepped and draped in the standard sterile manner.  The radiographic images were in the room.  Timeout was completed verifying the correct patient, surgical procedure, site and positioning, prior to beginning the procedure.  Pneumoperitoneum was introduced with an open Hassan technique in the midline supraumbilical. Fascia was incised and peritoneum was grasped and sharply incised. A trocar was placed into the abdomen and abdomen was insufflated under direct vision. The camera trocar was placed approximately 7-1/2 cm inferior and 2 cm medially to the planned position of the right robotic arm which was approximately 2 fingerbreadths inferior to the costal margin.  The abdominal cavity was examined for any signs of injury, adhesions and identification of anatomic landmarks. We then placed our right robotic trocar, left robotic trocar and fourth arm trocar.  A 12 mm assistant port was placed periumbilically.  The robot was then docked.  The white line of Toldt was incised and the colon was reflected medially, exposing Gerota's fascia.  The hepatic flexure was mobilized.  This allowed for further retraction of the liver.  The duodenum was kocherized and the inferior vena cava was visualized.  The retroperitoneal fascia overlying the renal vessels was carefully separated, exposing the underlying renal vein.  I was able to achieve an adequate lift using the fourth arm to elevate the kidney, further exposing the hilum.  The right renal vein and right renal artery were  carefully dissected and mobilized. I placed a vessel loop around the artery. I then turned my attention toward the anterior aspect of the kidney and proceed to defat the anterior surface of the kidney. This was tedious as he had significant toxic sticky fat overlying the  kidney. I then used an ultrasound and identified the mass. The mass was quite large, about 4cm and deep in the interpolar region adjacent to a lower pole right renal artery. Given its location, I did not think it was amenable for a partial nephrectomy as this would compromise surgical margins and a renorrhaphy would be complex increasing the risk of a urine leak.   A laparoscopic battery-operated stapler with a vascular load was used to control and ligate the right renal artery after placing a weck proximally on the artery.  A separate vascular load was used to secure the right renal vein.  Next, the vessel sealer was used to mobilize the upper pole and any remaining attachments to the right kidney. Special attention was given to the cranial connections to the adrenal gland, which were carefully divided using the vessel sealer, completely freeing the adrenal off the superior pole of the kidney.  The right ureter was divided using the stapler.   The kidney was placed in an Endo Catch bag.  The renal fossa and remainder of the operating field were inspected for bleeding or injury.  The insufflation pressure was reduced, again confirming the absence of bleeding.  Excellent hemostasis was obtained.  A mini-Gibson incision was made at the site of the fourth arm trocar.  The fascia was opened and the specimen was removed in a muscle-sparing fashion.  The ports were removed under direct vision.  Carter-Thomason device was used to close the 12 mm port.  All wounds were copiously irrigated and then infiltrated with Exparel.  The muscle was reapproximated using 0 Vicryl as a first layer. 1-0 PDS was used to close the fascia as a second layer.  The skin incisions were reapproximated with 4-0 Monocryl.  Dermabond was then applied on the skin.  At the end of the procedure, all counts were correct.  The patient tolerated the procedure well and was taken to the recovery room in satisfactory condition.  Jorge R. Tomeika Weinmann  MD [facility]  Pager: [phone]

## 2023-10-07 NOTE — Transfer of Care (Signed)
Immediate Anesthesia Transfer of Care Note  Patient: Jorge Alexander  Procedure(s) Performed: XI RIGHT ROBOTIC ASSISTED LAPAROSCOPIC RADICAL NEPHRECTOMY (Right: Abdomen)  Patient Location: PACU  Anesthesia Type:General  Level of Consciousness: sedated and responds to stimulation  Airway & Oxygen Therapy: Patient Spontanous Breathing and Patient connected to face mask oxygen  Post-op Assessment: Report given to RN and Post -op Vital signs reviewed and stable  Post vital signs: Reviewed and stable  Last Vitals:  Vitals Value Taken Time  BP 97/53 10/07/23 1240  Temp    Pulse 57 10/07/23 1243  Resp 16 10/07/23 1243  SpO2 100 % 10/07/23 1243  Vitals shown include unfiled device data.  Last Pain:  Vitals:   10/07/23 0619  TempSrc: Oral  PainSc:          Complications: No notable events documented.

## 2023-10-07 NOTE — Anesthesia Procedure Notes (Signed)
Procedure Name: Intubation Date/Time: 10/07/2023 7:45 AM  Performed by: Chinita Pester, CRNAPre-anesthesia Checklist: Patient identified, Emergency Drugs available, Suction available and Patient being monitored Patient Re-evaluated:Patient Re-evaluated prior to induction Oxygen Delivery Method: Circle System Utilized Preoxygenation: Pre-oxygenation with 100% oxygen Induction Type: IV induction Ventilation: Mask ventilation without difficulty Laryngoscope Size: 4 Grade View: Grade I Tube type: Oral Tube size: 7.5 mm Number of attempts: 1 Airway Equipment and Method: Stylet and Oral airway Placement Confirmation: ETT inserted through vocal cords under direct vision, positive ETCO2 and breath sounds checked- equal and bilateral Secured at: 22 cm Tube secured with: Tape Dental Injury: Teeth and Oropharynx as per pre-operative assessment

## 2023-10-08 ENCOUNTER — Other Ambulatory Visit: Payer: Self-pay

## 2023-10-08 ENCOUNTER — Encounter (HOSPITAL_COMMUNITY): Payer: Self-pay | Admitting: Urology

## 2023-10-08 LAB — BASIC METABOLIC PANEL
Anion gap: 10 (ref 5–15)
BUN: 43 mg/dL — ABNORMAL HIGH (ref 8–23)
CO2: 23 mmol/L (ref 22–32)
Calcium: 8.6 mg/dL — ABNORMAL LOW (ref 8.9–10.3)
Chloride: 103 mmol/L (ref 98–111)
Creatinine, Ser: 2.08 mg/dL — ABNORMAL HIGH (ref 0.61–1.24)
GFR, Estimated: 34 mL/min — ABNORMAL LOW (ref >60)
Glucose, Bld: 271 mg/dL — ABNORMAL HIGH (ref 70–99)
Potassium: 5 mmol/L (ref 3.5–5.1)
Sodium: 136 mmol/L (ref 135–145)

## 2023-10-08 LAB — GLUCOSE, CAPILLARY
Glucose-Capillary: 276 mg/dL — ABNORMAL HIGH (ref 70–99)
Glucose-Capillary: 276 mg/dL — ABNORMAL HIGH (ref 70–99)
Glucose-Capillary: 195 mg/dL — ABNORMAL HIGH (ref 70–99)
Glucose-Capillary: 162 mg/dL — ABNORMAL HIGH (ref 70–99)

## 2023-10-08 LAB — HEMOGLOBIN AND HEMATOCRIT, BLOOD
HCT: 31.2 % — ABNORMAL LOW (ref 39.0–52.0)
Hemoglobin: 10 g/dL — ABNORMAL LOW (ref 13.0–17.0)

## 2023-10-08 MED ORDER — SODIUM CHLORIDE 0.9 % IV SOLN
12.5000 mg | Freq: Four times a day (QID) | INTRAVENOUS | Status: DC | PRN
  Administered 2023-10-08: 12.5 mg via INTRAVENOUS
  Filled 2023-10-08: qty 12.5

## 2023-10-08 NOTE — Progress Notes (Signed)
2130: This RN attempted to walk patient. Patient sat up on the side of the bed and had an episode of vomiting. Patient was given IV zofran.  2200: This RN came back into the room to see if patient felt better to walk. Patient stood at the bedside and was able to march in place, but then became nauseous again. Patient had another small episode of vomiting but mostly dry heaving.   2230:This RN attempted again to walk patient, but patient started with dry heaving again. Patient stated "I don't think I'm going to be able to get up to walk. Can I just lay back in the bed for a little bit?" Patient returned to bed.  2300: This RN went in to check on patient. Patient states that he "feels fine." Denies having any nausea. Pain level is a 1/10. Patient states that he doesn't think he can walk tonight, but will attempt again to walk in the morning.

## 2023-10-08 NOTE — Progress Notes (Signed)
   1 Day Post-Op Subjective: POD#1 from robotic radical right nephrectomy Emesis x2 (once in the evening, once at 0500), patient states he feels nauseated with ambulation. Otherwise pain is controlled.   Objective: Vital signs in last 24 hours: Temp:  [97.5 F (36.4 C)-98.4 F (36.9 C)] 98.4 F (36.9 C) (10/12 0635) Pulse Rate:  [56-78] 78 (10/12 0635) Resp:  [12-18] 17 (10/12 0749) BP: (89-126)/(48-69) 126/69 (10/12 0635) SpO2:  [95 %-100 %] 97 % (10/12 0635) Arterial Line BP: (91-125)/(40-44) 112/42 (10/11 1700) Weight:  [112.9 kg] 112.9 kg (10/11 1958)  Assessment/Plan:  72yM s/p robotic right radical nephrectomy on 10/07/23. Having postoperative nausea/emesis.   Foley removed Add phenergan for nausea Continue CLD, ADAT SCH tylenol, PRN oxycodone for pain Anticipate DC tomorrow if patient's nausea improves  Intake/Output from previous day: 10/11 0701 - 10/12 0700 In: 1821 [P.O.:200; I.V.:1621] Out: 1750 [Urine:1400; Emesis/NG output:200; Blood:150]  Intake/Output this shift: No intake/output data recorded.  Physical Exam:  General: Alert and oriented CV: No cyanosis Lungs: equal chest rise Abdomen: Soft, NTND, no rebound or guarding Gu: Catheter removed  Lab Results: Recent Labs    10/07/23 1308 10/08/23 0358  HGB 11.1* 10.0*  HCT 34.4* 31.2*   BMET Recent Labs    10/08/23 0358  NA 136  K 5.0  CL 103  CO2 23  GLUCOSE 271*  BUN 43*  CREATININE 2.08*  CALCIUM 8.6*     Studies/Results: No results found.    LOS: 1 day   Cathren Harsh, MD [facility] Resident  [facility]rology    10/08/2023, 8:43 AM

## 2023-10-08 NOTE — Plan of Care (Signed)
Problem: Safety: Goal: Ability to remain free from injury will improve Outcome: Progressing   Problem: Skin Integrity: Goal: Risk for impaired skin integrity will decrease Outcome: Progressing   Problem: Pain Managment: Goal: General experience of comfort will improve Outcome: Progressing   Problem: Coping: Goal: Level of anxiety will decrease Outcome: Progressing   Problem: Nutrition: Goal: Adequate nutrition will be maintained Outcome: Progressing   Problem: Clinical Measurements: Goal: Ability to maintain clinical measurements within normal limits will improve Outcome: Progressing

## 2023-10-08 NOTE — Progress Notes (Signed)
Mobility Specialist - Progress Note   10/08/23 1323  Mobility  Activity Ambulated independently in hallway;Ambulated independently to bathroom  Level of Assistance Independent after set-up  Assistive Device None  Distance Ambulated (ft) 350 ft  Range of Motion/Exercises Active  Activity Response Tolerated well  Mobility Referral Yes  $Mobility charge 1 Mobility  Mobility Specialist Start Time (ACUTE ONLY) 1310  Mobility Specialist Stop Time (ACUTE ONLY) 1323  Mobility Specialist Time Calculation (min) (ACUTE ONLY) 13 min   Pt was found in bed and agreeable to ambulate. No complaints with session. At EOS returned to recliner chair with all needs met. Call bell in reach and RN notified.  Billey Chang Mobility Specialist

## 2023-10-08 NOTE — Plan of Care (Signed)
Problem: Education: Goal: Ability to describe self-care measures that may prevent or decrease complications (Diabetes Survival Skills Education) will improve Outcome: Progressing Goal: Individualized Educational Video(s) Outcome: Progressing   Problem: Coping: Goal: Ability to adjust to condition or change in health will improve Outcome: Progressing   Problem: Fluid Volume: Goal: Ability to maintain a balanced intake and output will improve Outcome: Progressing   Problem: Health Behavior/Discharge Planning: Goal: Ability to identify and utilize available resources and services will improve Outcome: Progressing Goal: Ability to manage health-related needs will improve Outcome: Progressing   Problem: Metabolic: Goal: Ability to maintain appropriate glucose levels will improve Outcome: Progressing   Problem: Nutritional: Goal: Maintenance of adequate nutrition will improve Outcome: Progressing Goal: Progress toward achieving an optimal weight will improve Outcome: Progressing   Problem: Skin Integrity: Goal: Risk for impaired skin integrity will decrease Outcome: Progressing   Problem: Tissue Perfusion: Goal: Adequacy of tissue perfusion will improve Outcome: Progressing   Problem: Education: Goal: Knowledge of the prescribed therapeutic regimen will improve Outcome: Progressing   Problem: Bowel/Gastric: Goal: Gastrointestinal status for postoperative course will improve Outcome: Progressing   Problem: Clinical Measurements: Goal: Postoperative complications will be avoided or minimized Outcome: Progressing   Problem: Respiratory: Goal: Ability to achieve and maintain a regular respiratory rate will improve Outcome: Progressing   Problem: Skin Integrity: Goal: Demonstration of wound healing without infection will improve Outcome: Progressing   Problem: Urinary Elimination: Goal: Ability to avoid or minimize complications of infection will improve Outcome:  Progressing Goal: Ability to achieve and maintain urine output will improve Outcome: Progressing   Problem: Education: Goal: Knowledge of General Education information will improve Description: Including pain rating scale, medication(s)/side effects and non-pharmacologic comfort measures Outcome: Progressing   Problem: Health Behavior/Discharge Planning: Goal: Ability to manage health-related needs will improve Outcome: Progressing   Problem: Clinical Measurements: Goal: Ability to maintain clinical measurements within normal limits will improve Outcome: Progressing Goal: Will remain free from infection Outcome: Progressing Goal: Diagnostic test results will improve Outcome: Progressing Goal: Respiratory complications will improve Outcome: Progressing Goal: Cardiovascular complication will be avoided Outcome: Progressing   Problem: Activity: Goal: Risk for activity intolerance will decrease Outcome: Progressing   Problem: Nutrition: Goal: Adequate nutrition will be maintained Outcome: Progressing   Problem: Coping: Goal: Level of anxiety will decrease Outcome: Progressing   Problem: Elimination: Goal: Will not experience complications related to bowel motility Outcome: Progressing Goal: Will not experience complications related to urinary retention Outcome: Progressing   Problem: Pain Managment: Goal: General experience of comfort will improve Outcome: Progressing   Problem: Safety: Goal: Ability to remain free from injury will improve Outcome: Progressing   Problem: Skin Integrity: Goal: Risk for impaired skin integrity will decrease Outcome: Progressing

## 2023-10-09 LAB — BASIC METABOLIC PANEL
Anion gap: 8 (ref 5–15)
BUN: 41 mg/dL — ABNORMAL HIGH (ref 8–23)
CO2: 25 mmol/L (ref 22–32)
Calcium: 8.7 mg/dL — ABNORMAL LOW (ref 8.9–10.3)
Chloride: 104 mmol/L (ref 98–111)
Creatinine, Ser: 2.15 mg/dL — ABNORMAL HIGH (ref 0.61–1.24)
GFR, Estimated: 32 mL/min — ABNORMAL LOW (ref >60)
Glucose, Bld: 200 mg/dL — ABNORMAL HIGH (ref 70–99)
Potassium: 4.6 mmol/L (ref 3.5–5.1)
Sodium: 137 mmol/L (ref 135–145)

## 2023-10-09 LAB — HEMOGLOBIN AND HEMATOCRIT, BLOOD
HCT: 32.1 % — ABNORMAL LOW (ref 39.0–52.0)
Hemoglobin: 10.2 g/dL — ABNORMAL LOW (ref 13.0–17.0)

## 2023-10-09 LAB — GLUCOSE, CAPILLARY: Glucose-Capillary: 196 mg/dL — ABNORMAL HIGH (ref 70–99)

## 2023-10-09 NOTE — Plan of Care (Signed)
  Problem: Safety: Goal: Ability to remain free from injury will improve Outcome: Progressing   Problem: Skin Integrity: Goal: Risk for impaired skin integrity will decrease Outcome: Progressing   Problem: Pain Managment: Goal: General experience of comfort will improve Outcome: Progressing   Problem: Coping: Goal: Level of anxiety will decrease Outcome: Progressing   Problem: Nutrition: Goal: Adequate nutrition will be maintained Outcome: Progressing   Problem: Clinical Measurements: Goal: Ability to maintain clinical measurements within normal limits will improve Outcome: Progressing

## 2023-10-09 NOTE — Discharge Summary (Signed)
Date of admission: 10/07/2023  Date of discharge: 10/09/2023  Admission diagnosis: right renal mass  Discharge diagnosis: same  Secondary diagnoses:  Patient Active Problem List   Diagnosis Date Noted   Renal mass 10/07/2023   Nephrolithiasis 05/19/2023   Renal lesion 05/19/2023   DMII (diabetes mellitus, type 2) (HCC) 05/19/2023   HTN (hypertension) 05/19/2023   Sleep apnea 05/19/2023   HLD (hyperlipidemia) 05/19/2023   Acute renal failure superimposed on stage 3b chronic kidney disease (HCC) 05/19/2023   Normocytic anemia 05/19/2023   Ureterolithiasis 05/18/2023    Procedures performed: Procedure(s): XI RIGHT ROBOTIC ASSISTED LAPAROSCOPIC RADICAL NEPHRECTOMY  History and Physical: For full details, please see admission history and physical. Briefly, Jorge SCHOOLS is a 72 y.o. year old patient with a 3cm right renal mass. He underwent robotic right radical nephrectomy on 10/07/23.   Hospital Course: Patient tolerated the procedure well.  He was then transferred to the floor after an uneventful PACU stay.  His hospital course was uncomplicated.  On POD#1 he had multiple episodes of emesis and was kept for monitoring. On POD#2 he had met discharge criteria: was eating a regular diet, was up and ambulating independently,  pain was well controlled, was voiding without a catheter, and was ready to for discharge.   Laboratory values:  Recent Labs    10/07/23 1308 10/08/23 0358 10/09/23 0400  HGB 11.1* 10.0* 10.2*  HCT 34.4* 31.2* 32.1*   Recent Labs    10/08/23 0358 10/09/23 0400  NA 136 137  K 5.0 4.6  CL 103 104  CO2 23 25  GLUCOSE 271* 200*  BUN 43* 41*  CREATININE 2.08* 2.15*  CALCIUM 8.6* 8.7*   No results for input(s): "LABPT", "INR" in the last 72 hours. No results for input(s): "LABURIN" in the last 72 hours. No results found for this or any previous visit.  Physical Exam  Gen: NAD Resp: Satting well on RA Card: Regular rate Abd: Soft, appropriately  tender, ND, incision clean dry and intact GU: Voing spontaneously  Neuro: Alert   Disposition: Home  Discharge instruction: The patient was instructed to be ambulatory but told to refrain from heavy lifting, strenuous activity, or driving.   Discharge medications:  Allergies as of 10/09/2023       Reactions   Liraglutide Nausea Only, Other (See Comments)   GI Intolerance (Victoza or Saxenda)        Medication List     STOP taking these medications    aspirin EC 81 MG tablet   Vitamin D3 1000 units Caps       TAKE these medications    amLODipine 10 MG tablet Commonly known as: NORVASC Take 10 mg by mouth daily.   atorvastatin 40 MG tablet Commonly known as: LIPITOR Take 40 mg by mouth at bedtime.   docusate sodium 100 MG capsule Commonly known as: COLACE Take 1 capsule (100 mg total) by mouth 2 (two) times daily.   Gvoke HypoPen 1-Pack 0.5 MG/0.1ML Soaj Generic drug: Glucagon Inject 0.5 mg into the skin once as needed (as directed for diabetic emergency).   Insulin Infusion Pump Devi Inject 1 Device into the skin continuous.   lisinopril 20 MG tablet Commonly known as: ZESTRIL Take 20 mg by mouth at bedtime.   NovoLOG 100 UNIT/ML injection Generic drug: insulin aspart Inject 0-100 Units into the skin See admin instructions. 0-100 units, PER INSULIN PUMP, every 3 days   oxyCODONE 5 MG immediate release tablet Commonly known as: Roxicodone Take  1 tablet (5 mg total) by mouth every 4 (four) hours as needed for severe pain or moderate pain.   UNABLE TO FIND Dexcome G 7 Blood Glucose Monitor        Followup:   Follow-up Information     Jannifer Hick, MD Follow up on 10/13/2023.   Specialty: Urology Why: at 10:15 Contact information: [address] [place] [address] [phone]

## 2023-10-10 LAB — SURGICAL PATHOLOGY
